# Patient Record
Sex: Female | Born: 1935 | Race: Black or African American | Hispanic: No | Smoking: Current every day smoker
Health system: Southern US, Community
[De-identification: ages and names within clinical notes are randomized; demographics above are authoritative.]

## PROBLEM LIST (undated history)

## (undated) DIAGNOSIS — M810 Age-related osteoporosis without current pathological fracture: Secondary | ICD-10-CM

## (undated) DIAGNOSIS — C50919 Malignant neoplasm of unspecified site of unspecified female breast: Secondary | ICD-10-CM

## (undated) DIAGNOSIS — Z923 Personal history of irradiation: Secondary | ICD-10-CM

## (undated) DIAGNOSIS — K219 Gastro-esophageal reflux disease without esophagitis: Secondary | ICD-10-CM

## (undated) DIAGNOSIS — E0590 Thyrotoxicosis, unspecified without thyrotoxic crisis or storm: Secondary | ICD-10-CM

## (undated) DIAGNOSIS — M1990 Unspecified osteoarthritis, unspecified site: Secondary | ICD-10-CM

## (undated) DIAGNOSIS — I10 Essential (primary) hypertension: Secondary | ICD-10-CM

## (undated) DIAGNOSIS — E785 Hyperlipidemia, unspecified: Secondary | ICD-10-CM

## (undated) DIAGNOSIS — E119 Type 2 diabetes mellitus without complications: Secondary | ICD-10-CM

## (undated) HISTORY — DX: Personal history of irradiation: Z92.3

## (undated) HISTORY — DX: Type 2 diabetes mellitus without complications: E11.9

## (undated) HISTORY — DX: Age-related osteoporosis without current pathological fracture: M81.0

## (undated) HISTORY — PX: ABDOMINAL HYSTERECTOMY: SHX81

## (undated) HISTORY — DX: Unspecified osteoarthritis, unspecified site: M19.90

## (undated) HISTORY — PX: BREAST SURGERY: SHX581

## (undated) HISTORY — DX: Hyperlipidemia, unspecified: E78.5

---

## 1986-03-26 HISTORY — PX: SPINE SURGERY: SHX786

## 1998-10-10 ENCOUNTER — Ambulatory Visit (HOSPITAL_COMMUNITY): Admission: RE | Admit: 1998-10-10 | Discharge: 1998-10-10 | Payer: Self-pay | Admitting: Family Medicine

## 1998-10-19 ENCOUNTER — Encounter: Payer: Self-pay | Admitting: Family Medicine

## 1998-10-19 ENCOUNTER — Ambulatory Visit (HOSPITAL_COMMUNITY): Admission: RE | Admit: 1998-10-19 | Discharge: 1998-10-19 | Payer: Self-pay

## 2000-11-19 ENCOUNTER — Encounter: Payer: Self-pay | Admitting: Family Medicine

## 2000-11-19 ENCOUNTER — Ambulatory Visit (HOSPITAL_COMMUNITY): Admission: RE | Admit: 2000-11-19 | Discharge: 2000-11-19 | Payer: Self-pay | Admitting: Family Medicine

## 2000-11-20 ENCOUNTER — Encounter: Payer: Self-pay | Admitting: Family Medicine

## 2000-11-20 ENCOUNTER — Ambulatory Visit (HOSPITAL_COMMUNITY): Admission: RE | Admit: 2000-11-20 | Discharge: 2000-11-20 | Payer: Self-pay | Admitting: Family Medicine

## 2002-03-20 ENCOUNTER — Ambulatory Visit (HOSPITAL_COMMUNITY): Admission: RE | Admit: 2002-03-20 | Discharge: 2002-03-20 | Payer: Self-pay | Admitting: Internal Medicine

## 2002-03-20 ENCOUNTER — Encounter: Payer: Self-pay | Admitting: Internal Medicine

## 2002-06-15 ENCOUNTER — Encounter (INDEPENDENT_AMBULATORY_CARE_PROVIDER_SITE_OTHER): Payer: Self-pay | Admitting: Specialist

## 2002-06-15 ENCOUNTER — Ambulatory Visit (HOSPITAL_COMMUNITY): Admission: RE | Admit: 2002-06-15 | Discharge: 2002-06-15 | Payer: Self-pay | Admitting: Gastroenterology

## 2007-07-23 ENCOUNTER — Encounter: Admission: RE | Admit: 2007-07-23 | Discharge: 2007-07-23 | Payer: Self-pay | Admitting: Internal Medicine

## 2008-09-01 ENCOUNTER — Encounter: Admission: RE | Admit: 2008-09-01 | Discharge: 2008-09-01 | Payer: Self-pay | Admitting: Internal Medicine

## 2009-09-16 ENCOUNTER — Encounter: Admission: RE | Admit: 2009-09-16 | Discharge: 2009-09-16 | Payer: Self-pay | Admitting: Internal Medicine

## 2010-02-21 ENCOUNTER — Encounter
Admission: RE | Admit: 2010-02-21 | Discharge: 2010-04-25 | Payer: Self-pay | Source: Home / Self Care | Attending: Internal Medicine | Admitting: Internal Medicine

## 2010-04-16 ENCOUNTER — Encounter: Payer: Self-pay | Admitting: Internal Medicine

## 2010-05-29 ENCOUNTER — Encounter: Admit: 2010-05-29 | Payer: Self-pay | Admitting: Internal Medicine

## 2010-05-29 ENCOUNTER — Encounter: Payer: Medicare Other | Attending: Internal Medicine | Admitting: *Deleted

## 2010-05-29 DIAGNOSIS — Z713 Dietary counseling and surveillance: Secondary | ICD-10-CM | POA: Insufficient documentation

## 2010-05-29 DIAGNOSIS — R634 Abnormal weight loss: Secondary | ICD-10-CM | POA: Insufficient documentation

## 2010-07-31 ENCOUNTER — Encounter: Payer: Medicare Other | Admitting: *Deleted

## 2010-08-11 NOTE — Op Note (Signed)
   NAME:  Rhonda Reynolds, Rhonda Reynolds                        ACCOUNT NO.:  [id number]   MEDICAL RECORD NO.:  [id number]                   PATIENT TYPE:  AMB   LOCATION:  ENDO                                 FACILITY:   PHYSICIAN:  Anselmo Rod, M.D.               DATE OF BIRTH:  14-Mar-1935   DATE OF PROCEDURE:  06/15/2002  DATE OF DISCHARGE:                                 OPERATIVE REPORT   PROCEDURE:  Esophagogastroduodenoscopy.   ENDOSCOPIST:  Charna Elizabeth, M.D.   INSTRUMENT USED:  Olympus video panendoscope.   INDICATIONS FOR PROCEDURE:  A 76 year old African-American female with a  history of epigastric pain.  Rule out peptic ulcer disease, esophagitis,  gastritis, etc.   PREPROCEDURE PREPARATION:  Informed consent was procured from the patient.  The patient was fasted for eight hours prior to the procedure.   PREPROCEDURE PHYSICAL:  VITAL SIGNS: The patient had stable vital signs.  NECK:  Supple.  CHEST:  Clear to auscultation.  CARDIAC:  S1 and S2 regular.  ABDOMEN:  Soft with normal bowel sounds.   DESCRIPTION OF PROCEDURE:  The patient was placed in the left lateral  decubitus position and sedated with 50 mg of Demerol and 5 mg of Versed  intravenously.  Once the patient was adequately sedated, maintained on low-  flow oxygen and continuous cardiac monitoring, the Olympus video  panendoscope was advance through the mouth piece over the tongue, into the  esophagus under direct vision.  The entire esophagus appeared normal with no  evidence of rings, stricture, masses, esophagitis or Barrett's mucosa.  The  scope was then advanced into the stomach. There was mild antral gastritis  noted.  On retroflexion, the high cardia revealed no abnormalities.  The  proximal small bowel was normal as well.   IMPRESSION:  Normal esophagogastroduodenoscopy except for mild antral  gastritis.   RECOMMENDATIONS:  Proceed with colonoscopy at this time.                      Anselmo Rod, M.D.    JNM/MEDQ  D:  06/15/2002  T:  06/15/2002  Job:  [id number]   cc:   Candyce Churn. Allyne Gee, M.D.  [address]  [address]  [place]  [place] [address]  Fax: [phone]

## 2010-11-09 ENCOUNTER — Other Ambulatory Visit: Payer: Self-pay | Admitting: Internal Medicine

## 2010-11-09 DIAGNOSIS — Z1231 Encounter for screening mammogram for malignant neoplasm of breast: Secondary | ICD-10-CM

## 2010-11-16 ENCOUNTER — Ambulatory Visit
Admission: RE | Admit: 2010-11-16 | Discharge: 2010-11-16 | Disposition: A | Discharge status: Home / Self Care | Payer: Medicare Other | Source: Ambulatory Visit | Attending: Internal Medicine | Admitting: Internal Medicine

## 2010-11-16 DIAGNOSIS — Z1231 Encounter for screening mammogram for malignant neoplasm of breast: Secondary | ICD-10-CM

## 2011-12-05 ENCOUNTER — Ambulatory Visit
Admission: RE | Admit: 2011-12-05 | Discharge: 2011-12-05 | Disposition: A | Discharge status: Home / Self Care | Payer: Medicare Other | Source: Ambulatory Visit | Attending: Internal Medicine | Admitting: Internal Medicine

## 2011-12-05 ENCOUNTER — Other Ambulatory Visit: Payer: Self-pay | Admitting: Internal Medicine

## 2011-12-05 DIAGNOSIS — Z1231 Encounter for screening mammogram for malignant neoplasm of breast: Secondary | ICD-10-CM

## 2012-03-26 HISTORY — PX: EYE SURGERY: SHX253

## 2012-12-18 ENCOUNTER — Other Ambulatory Visit: Payer: Self-pay

## 2012-12-18 DIAGNOSIS — Z1231 Encounter for screening mammogram for malignant neoplasm of breast: Secondary | ICD-10-CM

## 2013-01-16 ENCOUNTER — Ambulatory Visit
Admission: RE | Admit: 2013-01-16 | Discharge: 2013-01-16 | Disposition: A | Discharge status: Home / Self Care | Payer: Medicare Other | Source: Ambulatory Visit

## 2013-01-16 DIAGNOSIS — Z1231 Encounter for screening mammogram for malignant neoplasm of breast: Secondary | ICD-10-CM

## 2013-01-20 ENCOUNTER — Other Ambulatory Visit: Payer: Self-pay | Admitting: Internal Medicine

## 2013-01-20 DIAGNOSIS — R928 Other abnormal and inconclusive findings on diagnostic imaging of breast: Secondary | ICD-10-CM

## 2013-01-27 ENCOUNTER — Ambulatory Visit
Admission: RE | Admit: 2013-01-27 | Discharge: 2013-01-27 | Disposition: A | Discharge status: Home / Self Care | Payer: Medicare Other | Source: Ambulatory Visit | Attending: Internal Medicine | Admitting: Internal Medicine

## 2013-01-27 DIAGNOSIS — R928 Other abnormal and inconclusive findings on diagnostic imaging of breast: Secondary | ICD-10-CM

## 2013-02-04 ENCOUNTER — Ambulatory Visit (INDEPENDENT_AMBULATORY_CARE_PROVIDER_SITE_OTHER): Payer: Medicare Other | Admitting: Surgery

## 2013-02-04 ENCOUNTER — Encounter (INDEPENDENT_AMBULATORY_CARE_PROVIDER_SITE_OTHER): Payer: Self-pay | Admitting: Surgery

## 2013-02-04 VITALS — BP 122/78 | HR 76 | Resp 16 | Ht 67.0 in | Wt 85.0 lb

## 2013-02-04 DIAGNOSIS — R928 Other abnormal and inconclusive findings on diagnostic imaging of breast: Secondary | ICD-10-CM

## 2013-02-04 DIAGNOSIS — R921 Mammographic calcification found on diagnostic imaging of breast: Secondary | ICD-10-CM | POA: Insufficient documentation

## 2013-02-04 NOTE — Progress Notes (Signed)
Patient ID: Rhonda Reynolds, female   DOB: 08/09/1934, 78 y.o.   MRN: [id number]  Chief Complaint  Patient presents with  . New Evaluation    consult lt breast calcifications    HPI Rhonda Reynolds is a 78 y.o. female.   Referred by Dr. Lyman Bishop for evaluation of left breast microcalcifications.  PCP - Dr. Dorothyann Peng  HPI This is a 78 yo female in good health that presents after a recent routine screening mammogram that showed a 5mm area of linear microcalcifications located in the outer posterior left breast.  The patient has very ptotic breasts with minimal thickness of the breast tissue, and it was felt to be to risky to attempt stereotactic biopsy.  Surgical biopsy is recommended.  She presents now for discussion of excisional biopsy.    The patient used Premarin for about 15 years. No family history of breast cancer  Past Medical History  Diagnosis Date  . Arthritis   . Hyperlipidemia   . Osteoporosis     Past Surgical History  Procedure Laterality Date  . Abdominal hysterectomy    . Spine surgery      Family History  Problem Relation Age of Onset  . Cancer Mother     pt canot remember    Social History History  Substance Use Topics  . Smoking status: Current Every Day Smoker  . Smokeless tobacco: Never Used  . Alcohol Use: No    No Known Allergies  Current Outpatient Prescriptions  Medication Sig Dispense Refill  . Cholecalciferol (VITAMIN D PO) Take by mouth.      Marland Kitchen lisinopril-hydrochlorothiazide (PRINZIDE,ZESTORETIC) 20-12.5 MG per tablet        No current facility-administered medications for this visit.    Review of Systems Review of Systems  Constitutional: Negative for fever, chills and unexpected weight change.  HENT: Negative for congestion, hearing loss, sore throat, trouble swallowing and voice change.   Eyes: Negative for visual disturbance.  Respiratory: Negative for cough and wheezing.   Cardiovascular: Negative for chest pain,  palpitations and leg swelling.  Gastrointestinal: Negative for nausea, vomiting, abdominal pain, diarrhea, constipation, blood in stool, abdominal distention and anal bleeding.  Genitourinary: Negative for hematuria, vaginal bleeding and difficulty urinating.  Musculoskeletal: Negative for arthralgias.  Skin: Negative for rash and wound.  Neurological: Negative for seizures, syncope and headaches.  Hematological: Negative for adenopathy. Does not bruise/bleed easily.  Psychiatric/Behavioral: Negative for confusion.    Blood pressure 122/78, pulse 76, resp. rate 16, height 5\' 7"  (1.702 m), weight 85 lb (38.556 kg).  Physical Exam Physical Exam Very thin female in NAD HEENT:  EOMI, sclera anicteric Neck:  No masses, no thyromegaly Lungs:  CTA bilaterally; normal respiratory effort Breasts - minimal palpable breast tissue; no dominant masses in either breast; no axillary lymphadenopathy;   CV:  Regular rate and rhythm; no murmurs Abd:  +bowel sounds, soft, non-tender, no masses Ext:  Well-perfused; no edema Skin:  Warm, dry; no sign of jaundice  Data Reviewed Mm Digital Diag Ltd L  01/27/2013   CLINICAL DATA:  Callback from screening mammography with tomosynthesis, left breast calcifications.  EXAM: DIGITAL DIAGNOSTIC UNILATERAL LEFT MAMMOGRAM LIMITED  COMPARISON:  01/16/2013, 12/05/2011, dating back to 07/23/2007.  ACR Breast Density Category d: The breasts are extremely dense, which lowers the sensitivity of mammography.  FINDINGS: Spot magnification CC and MLO views of the calcifications in the outer left breast, posterior 1/3, were obtained. These calcifications are new when compared to prior examinations,  span approximately 5 mm, are in a linear distribution, and are of varying shapes and sizes. These calcifications are immediately adjacent to a benign oval-shaped calcification with a lucent center.  IMPRESSION: Indeterminate 5 mm group of calcifications in a linear orientation in the  outer left breast, posterior 1/3.  RECOMMENDATION: The patient's left breast compressed to approximately 1 cm in thickness on the current spot compression images, and therefore, stereotactic biopsy would not be technically possible, as this would require a minimum compression of 2 cm. Therefore, excisional biopsy is recommended.  An appointment has been made with Dr. Corliss Skains of [facility] for Wednesday, November 12, at 10 o'clock a.m.  I have discussed the findings and recommendations with the patient. Results were also provided in writing at the conclusion of the visit. If applicable, a reminder letter will be sent to the patient regarding the next appointment.  BI-RADS CATEGORY  4: Suspicious abnormality - biopsy should be considered.   Electronically Signed   By: Hulan Saas M.D.   On: 01/27/2013 15:45   Mm Digital Screening  01/16/2013   CLINICAL DATA:  Screening.  EXAM: DIGITAL SCREENING BILATERAL MAMMOGRAM WITH CAD  DIGITAL BREAST TOMOSYNTHESIS  Digital breast tomosynthesis images are acquired in two projections. These images are reviewed in combination with the digital mammogram, confirming the findings below.  COMPARISON:  Previous Exam(s)  ACR Breast Density Category d.  Extreme fibroglandular tissue  FINDINGS: In the left breast, calcifications warrant further evaluation with magnified views. In the right breast, no mass or malignant type calcifications are identified. Images were processed with CAD.  IMPRESSION: Further evaluation is suggested for calcifications in the left breast.  RECOMMENDATION: Diagnostic mammogram of the left breast. (Code:FI-L-34M)  The patient will be contacted regarding the findings, and additional imaging will be scheduled.  BI-RADS CATEGORY  0. Incomplete. Need additional imaging evaluation and/or prior mammograms for comparison.   Electronically Signed   By: Sherian Rein M.D.   On: 01/16/2013 13:38      Assessment    Cluster of microcalcifications  of the lateral left breast     Plan    Recommend needle-localized lumpectomy of the left breast.  The surgical procedure has been discussed with the patient.  Potential risks, benefits, alternative treatments, and expected outcomes have been explained.  All of the patient's questions at this time have been answered.  The likelihood of reaching the patient's treatment goal is good.  The patient understand the proposed surgical procedure.  She wants to think about it before agreeing to surgery.  If she chooses not to have surgery at this time, I would definitely recommend close follow-up mammogram (6 months) to see if the microcalcifications have changed.  I did not recommend waiting, but the patient seems reluctant to schedule surgery at this time.  She will call back with her decision.        Jasmain Ahlberg K. 02/04/2013, 3:50 PM

## 2013-02-04 NOTE — Patient Instructions (Signed)
Call our surgery schedulers at [phone] to schedule your surgery. 

## 2013-02-04 NOTE — Addendum Note (Signed)
Addended by: Wynona Luna on: 02/04/2013 04:03 PM   Modules accepted: Orders

## 2013-02-10 ENCOUNTER — Telehealth (INDEPENDENT_AMBULATORY_CARE_PROVIDER_SITE_OTHER): Payer: Self-pay | Admitting: General Surgery

## 2013-02-10 NOTE — Telephone Encounter (Signed)
LMOM to have patient to call back and ask for [facility]. She called and had question about breast surgery

## 2013-02-10 NOTE — Telephone Encounter (Signed)
Pt called to ask some generalized questions about proposed surgery.  Asked about anesthesia, driving home from surgery, driving in general after surgery.  All questions answered to her satisfaction.  She is still considering her options and will call back with decision soon.

## 2013-02-25 ENCOUNTER — Encounter (HOSPITAL_COMMUNITY): Payer: Self-pay | Admitting: Pharmacist

## 2013-03-02 ENCOUNTER — Encounter (HOSPITAL_COMMUNITY)
Admission: RE | Admit: 2013-03-02 | Discharge: 2013-03-02 | Disposition: A | Discharge status: Home / Self Care | Payer: Medicare Other | Source: Ambulatory Visit | Attending: Surgery | Admitting: Surgery

## 2013-03-02 ENCOUNTER — Ambulatory Visit (HOSPITAL_COMMUNITY)
Admission: RE | Admit: 2013-03-02 | Discharge: 2013-03-02 | Disposition: A | Discharge status: Home / Self Care | Payer: Medicare Other | Source: Ambulatory Visit | Attending: Anesthesiology | Admitting: Anesthesiology

## 2013-03-02 ENCOUNTER — Other Ambulatory Visit (HOSPITAL_COMMUNITY): Payer: Self-pay | Admitting: *Deleted

## 2013-03-02 ENCOUNTER — Encounter (HOSPITAL_COMMUNITY): Payer: Self-pay

## 2013-03-02 DIAGNOSIS — Z01818 Encounter for other preprocedural examination: Secondary | ICD-10-CM | POA: Insufficient documentation

## 2013-03-02 DIAGNOSIS — J449 Chronic obstructive pulmonary disease, unspecified: Secondary | ICD-10-CM | POA: Insufficient documentation

## 2013-03-02 DIAGNOSIS — C50919 Malignant neoplasm of unspecified site of unspecified female breast: Secondary | ICD-10-CM | POA: Insufficient documentation

## 2013-03-02 DIAGNOSIS — Z01810 Encounter for preprocedural cardiovascular examination: Secondary | ICD-10-CM | POA: Insufficient documentation

## 2013-03-02 DIAGNOSIS — J4489 Other specified chronic obstructive pulmonary disease: Secondary | ICD-10-CM | POA: Insufficient documentation

## 2013-03-02 DIAGNOSIS — Z01812 Encounter for preprocedural laboratory examination: Secondary | ICD-10-CM | POA: Insufficient documentation

## 2013-03-02 HISTORY — DX: Essential (primary) hypertension: I10

## 2013-03-02 HISTORY — DX: Gastro-esophageal reflux disease without esophagitis: K21.9

## 2013-03-02 LAB — CBC
HCT: 43.4 % (ref 36.0–46.0)
MCH: 31.5 pg (ref 26.0–34.0)
MCV: 92.3 fL (ref 78.0–100.0)
RBC: 4.7 MIL/uL (ref 3.87–5.11)
RDW: 14 % (ref 11.5–15.5)
WBC: 7 10*3/uL (ref 4.0–10.5)

## 2013-03-02 LAB — BASIC METABOLIC PANEL
BUN: 10 mg/dL (ref 6–23)
CO2: 27 mEq/L (ref 19–32)
Chloride: 101 mEq/L (ref 96–112)
Creatinine, Ser: 0.6 mg/dL (ref 0.50–1.10)
GFR calc Af Amer: >90 mL/min (ref >90)
GFR calc non Af Amer: 86 mL/min — ABNORMAL LOW (ref >90)
Glucose, Bld: 102 mg/dL — ABNORMAL HIGH (ref 70–99)
Potassium: 3.8 mEq/L (ref 3.5–5.1)

## 2013-03-02 NOTE — Progress Notes (Addendum)
req'd notes, ekg, any tests from pcp dr Zella Ball [facility]

## 2013-03-02 NOTE — Pre-Procedure Instructions (Addendum)
Rhonda Reynolds  03/02/2013   Your procedure is scheduled on: 03/04/13  Report to  [facility] short stay admitting at when finish at breast center AM.  Call this number if you have problems the morning of surgery: [phone]   Remember:   Do not eat food or drink liquids after midnight.   Take these medicines the morning of surgery with A SIP OF WATER: none   Do not wear jewelry, make-up or nail polish.  Do not wear lotions, powders, or perfumes. You may wear deodorant.  Do not shave 48 hours prior to surgery. Men may shave face and neck.  Do not bring valuables to the hospital.  [facility] is not responsible                  for any belongings or valuables.               Contacts, dentures or bridgework may not be worn into surgery.  Leave suitcase in the car. After surgery it may be brought to your room.  For patients admitted to the hospital, discharge time is determined by your                treatment team.               Patients discharged the day of surgery will not be allowed to drive  home.  Name and phone number of your driver:   Special Instructions: Shower using CHG 2 nights before surgery and the night before surgery.  If you shower the day of surgery use CHG.  Use special wash - you have one bottle of CHG for all showers.  You should use approximately 1/3 of the bottle for each shower.   Please read over the following fact sheets that you were given: Pain Booklet, Coughing and Deep Breathing and Surgical Site Infection Prevention

## 2013-03-03 MED ORDER — CHLORHEXIDINE GLUCONATE 4 % EX LIQD: 1.0000 "application " | Freq: Once | CUTANEOUS | Status: DC

## 2013-03-03 MED ORDER — CEFAZOLIN SODIUM-DEXTROSE 2-3 GM-% IV SOLR
2.0000 g | INTRAVENOUS | Status: AC
  Administered 2013-03-04: 2 g via INTRAVENOUS
  Filled 2013-03-03: qty 50

## 2013-03-03 NOTE — Progress Notes (Signed)
Anesthesia Chart Review:  Patient is a 78 year old female scheduled for left needle localized lumpectomy on 03/04/13 by Dr. Corliss Skains.  History includes smoking, HTN, GERD, HLD, osteoporosis, arthritis, hysterectomy, back surgery.  Her BMI is recorded as only 13.  PCP is Dr. Dorothyann Peng.    EKG on 03/02/13 showed NSR, LAE, LAFB. EKG appears stable when compared to prior EKG at Dr. Zella Ball office from 06/02/12.  No CV symptoms documented at her PAT visit.  CXR on 03/02/13 showed COPD, no acute cardiopulmonary abnormality.  Preoperative labs noted.  If no acute changes then I anticipate that she can proceed as planned.  Velna Ochs [facility] Short Stay Center/Anesthesiology Phone [phone] 03/03/2013 9:53 AM

## 2013-03-04 ENCOUNTER — Ambulatory Visit
Admission: RE | Admit: 2013-03-04 | Discharge: 2013-03-04 | Disposition: A | Discharge status: Home / Self Care | Payer: Medicare Other | Source: Ambulatory Visit | Attending: Surgery | Admitting: Surgery

## 2013-03-04 ENCOUNTER — Ambulatory Visit (HOSPITAL_COMMUNITY)
Admission: RE | Admit: 2013-03-04 | Discharge: 2013-03-04 | Disposition: A | Discharge status: Home / Self Care | Payer: Medicare Other | Source: Ambulatory Visit | Attending: Surgery | Admitting: Surgery

## 2013-03-04 ENCOUNTER — Encounter (HOSPITAL_COMMUNITY)
Admission: RE | Disposition: A | Discharge status: Home / Self Care | Payer: Self-pay | Source: Ambulatory Visit | Attending: Surgery

## 2013-03-04 ENCOUNTER — Ambulatory Visit (HOSPITAL_COMMUNITY): Payer: Medicare Other | Admitting: Anesthesiology

## 2013-03-04 ENCOUNTER — Encounter (HOSPITAL_COMMUNITY): Payer: Self-pay | Admitting: *Deleted

## 2013-03-04 ENCOUNTER — Encounter (HOSPITAL_COMMUNITY): Payer: Medicare Other | Admitting: Vascular Surgery

## 2013-03-04 DIAGNOSIS — N6029 Fibroadenosis of unspecified breast: Secondary | ICD-10-CM | POA: Insufficient documentation

## 2013-03-04 DIAGNOSIS — R921 Mammographic calcification found on diagnostic imaging of breast: Secondary | ICD-10-CM

## 2013-03-04 DIAGNOSIS — M810 Age-related osteoporosis without current pathological fracture: Secondary | ICD-10-CM | POA: Insufficient documentation

## 2013-03-04 DIAGNOSIS — F17200 Nicotine dependence, unspecified, uncomplicated: Secondary | ICD-10-CM | POA: Insufficient documentation

## 2013-03-04 DIAGNOSIS — K219 Gastro-esophageal reflux disease without esophagitis: Secondary | ICD-10-CM | POA: Insufficient documentation

## 2013-03-04 DIAGNOSIS — D0590 Unspecified type of carcinoma in situ of unspecified breast: Secondary | ICD-10-CM | POA: Insufficient documentation

## 2013-03-04 DIAGNOSIS — M129 Arthropathy, unspecified: Secondary | ICD-10-CM | POA: Insufficient documentation

## 2013-03-04 DIAGNOSIS — I10 Essential (primary) hypertension: Secondary | ICD-10-CM | POA: Insufficient documentation

## 2013-03-04 DIAGNOSIS — E785 Hyperlipidemia, unspecified: Secondary | ICD-10-CM | POA: Insufficient documentation

## 2013-03-04 HISTORY — PX: BREAST LUMPECTOMY WITH NEEDLE LOCALIZATION: SHX5759

## 2013-03-04 HISTORY — PX: BREAST LUMPECTOMY: SHX2

## 2013-03-04 SURGERY — BREAST LUMPECTOMY WITH NEEDLE LOCALIZATION
Anesthesia: General | Laterality: Left

## 2013-03-04 MED ORDER — LACTATED RINGERS IV SOLN
INTRAVENOUS | Status: DC
  Administered 2013-03-04: 13:00:00 via INTRAVENOUS

## 2013-03-04 MED ORDER — 0.9 % SODIUM CHLORIDE (POUR BTL) OPTIME
TOPICAL | Status: DC | PRN
  Administered 2013-03-04: 1000 mL

## 2013-03-04 MED ORDER — LACTATED RINGERS IV SOLN
INTRAVENOUS | Status: DC | PRN
  Administered 2013-03-04: 14:00:00 via INTRAVENOUS

## 2013-03-04 MED ORDER — HYDROCODONE-ACETAMINOPHEN 5-325 MG PO TABS: 1.0000 | ORAL_TABLET | ORAL | Status: DC | PRN

## 2013-03-04 MED ORDER — PHENYLEPHRINE HCL 10 MG/ML IJ SOLN
INTRAMUSCULAR | Status: DC | PRN
  Administered 2013-03-04: 40 ug via INTRAVENOUS
  Administered 2013-03-04 (×2): 120 ug via INTRAVENOUS
  Administered 2013-03-04: 40 ug via INTRAVENOUS
  Administered 2013-03-04: 80 ug via INTRAVENOUS

## 2013-03-04 MED ORDER — PHENYLEPHRINE HCL 10 MG/ML IJ SOLN
10.0000 mg | INTRAVENOUS | Status: DC | PRN
  Administered 2013-03-04: 10 ug/min via INTRAVENOUS

## 2013-03-04 MED ORDER — MIDAZOLAM HCL 2 MG/2ML IJ SOLN: 1.0000 mg | INTRAMUSCULAR | Status: DC | PRN

## 2013-03-04 MED ORDER — FENTANYL CITRATE 0.05 MG/ML IJ SOLN
INTRAMUSCULAR | Status: DC | PRN
  Administered 2013-03-04: 50 ug via INTRAVENOUS

## 2013-03-04 MED ORDER — ONDANSETRON HCL 4 MG/2ML IJ SOLN: 4.0000 mg | INTRAMUSCULAR | Status: DC | PRN

## 2013-03-04 MED ORDER — FENTANYL CITRATE 0.05 MG/ML IJ SOLN: 50.0000 ug | Freq: Once | INTRAMUSCULAR | Status: DC

## 2013-03-04 MED ORDER — LIDOCAINE HCL 1 % IJ SOLN
INTRAMUSCULAR | Status: DC | PRN
  Administered 2013-03-04: 60 mg via INTRADERMAL

## 2013-03-04 MED ORDER — ONDANSETRON HCL 4 MG/2ML IJ SOLN
INTRAMUSCULAR | Status: DC | PRN
  Administered 2013-03-04: 4 mg via INTRAVENOUS

## 2013-03-04 MED ORDER — BUPIVACAINE-EPINEPHRINE 0.25% -1:200000 IJ SOLN
INTRAMUSCULAR | Status: DC | PRN
  Administered 2013-03-04: 3 mL

## 2013-03-04 MED ORDER — MORPHINE SULFATE 2 MG/ML IJ SOLN: 2.0000 mg | INTRAMUSCULAR | Status: DC | PRN

## 2013-03-04 MED ORDER — PROPOFOL 10 MG/ML IV BOLUS
INTRAVENOUS | Status: DC | PRN
  Administered 2013-03-04: 100 mg via INTRAVENOUS

## 2013-03-04 MED ORDER — BUPIVACAINE-EPINEPHRINE (PF) 0.25% -1:200000 IJ SOLN
INTRAMUSCULAR | Status: AC
  Filled 2013-03-04: qty 30

## 2013-03-04 SURGICAL SUPPLY — 41 items
APPLIER CLIP 9.375 MED OPEN (MISCELLANEOUS)
BENZOIN TINCTURE PRP APPL 2/3 (GAUZE/BANDAGES/DRESSINGS) ×2 IMPLANT
BINDER BREAST LRG (GAUZE/BANDAGES/DRESSINGS) IMPLANT
BINDER BREAST XLRG (GAUZE/BANDAGES/DRESSINGS) IMPLANT
BLADE SURG ROTATE 9660 (MISCELLANEOUS) IMPLANT
CHLORAPREP W/TINT 26ML (MISCELLANEOUS) ×2 IMPLANT
CLIP APPLIE 9.375 MED OPEN (MISCELLANEOUS) IMPLANT
CLIP TI WIDE RED SMALL 24 (CLIP) IMPLANT
CLSR STERI-STRIP ANTIMIC 1/2X4 (GAUZE/BANDAGES/DRESSINGS) ×2 IMPLANT
CONT SPEC 4OZ CLIKSEAL STRL BL (MISCELLANEOUS) ×2 IMPLANT
COVER SURGICAL LIGHT HANDLE (MISCELLANEOUS) ×2 IMPLANT
DECANTER SPIKE VIAL GLASS SM (MISCELLANEOUS) IMPLANT
DEVICE DUBIN SPECIMEN MAMMOGRA (MISCELLANEOUS) IMPLANT
DRAPE LAPAROTOMY TRNSV 102X78 (DRAPE) ×2 IMPLANT
DRAPE UTILITY 15X26 W/TAPE STR (DRAPE) ×4 IMPLANT
DRSG OPSITE POSTOP 3X4 (GAUZE/BANDAGES/DRESSINGS) ×2 IMPLANT
ELECT REM PT RETURN 9FT ADLT (ELECTROSURGICAL) ×2
ELECTRODE REM PT RTRN 9FT ADLT (ELECTROSURGICAL) ×1 IMPLANT
GAUZE SPONGE 4X4 16PLY XRAY LF (GAUZE/BANDAGES/DRESSINGS) ×2 IMPLANT
GLOVE BIO SURGEON STRL SZ7 (GLOVE) ×2 IMPLANT
GLOVE BIOGEL PI IND STRL 7.5 (GLOVE) ×1 IMPLANT
GLOVE BIOGEL PI INDICATOR 7.5 (GLOVE) ×1
GOWN STRL NON-REIN LRG LVL3 (GOWN DISPOSABLE) ×4 IMPLANT
KIT BASIN OR (CUSTOM PROCEDURE TRAY) ×2 IMPLANT
KIT MARKER MARGIN INK (KITS) IMPLANT
KIT ROOM TURNOVER OR (KITS) ×2 IMPLANT
NEEDLE HYPO 25GX1X1/2 BEV (NEEDLE) ×2 IMPLANT
NS IRRIG 1000ML POUR BTL (IV SOLUTION) ×2 IMPLANT
PACK GENERAL/GYN (CUSTOM PROCEDURE TRAY) ×2 IMPLANT
PAD ARMBOARD 7.5X6 YLW CONV (MISCELLANEOUS) ×4 IMPLANT
SPECIMEN JAR MEDIUM (MISCELLANEOUS) ×2 IMPLANT
SPONGE GAUZE 4X4 12PLY (GAUZE/BANDAGES/DRESSINGS) ×2 IMPLANT
STAPLER VISISTAT 35W (STAPLE) ×2 IMPLANT
STRIP CLOSURE SKIN 1/2X4 (GAUZE/BANDAGES/DRESSINGS) ×2 IMPLANT
SUT MNCRL AB 4-0 PS2 18 (SUTURE) ×2 IMPLANT
SUT VIC AB 3-0 SH 27 (SUTURE) ×1
SUT VIC AB 3-0 SH 27X BRD (SUTURE) ×1 IMPLANT
SYR CONTROL 10ML LL (SYRINGE) ×2 IMPLANT
TOWEL OR 17X24 6PK STRL BLUE (TOWEL DISPOSABLE) ×2 IMPLANT
TOWEL OR 17X26 10 PK STRL BLUE (TOWEL DISPOSABLE) ×2 IMPLANT
WATER STERILE IRR 1000ML POUR (IV SOLUTION) IMPLANT

## 2013-03-04 NOTE — Op Note (Signed)
Pre-op Diagnosis:  Abnormal mammogram - left breast Post-op Diagnosis;  Same Procedure:  Left needle-localized lumpectomy Surgeon:  Lynnell Fiumara K. Anesthesia:  Gen-LMA Indications - This is a 78 yo female that presents after a recent mammogram showed a 5 mm area of linear microcalcifications in the lateral posterior left breast.  This area was not amenable to a safe stereotactic biopsy, so surgical biopsy was recommended.  A wire was placed by radiology prior to surgery.  Description of procedure:  After an adequate level of anesthesia was obtained, her left breast was prepped with Chloraprep and draped in sterile fashion.  A transverse incision was made to include the insertion site.  A cylinder of breast tissue was excised using cautery, extending down about 2.5 cm.  The specimen was oriented with a paint kit and a specimen mammogram confirmed the microcalcifications.  The wound was closed with 3-0 Vicryl and 4-0 monocryl.  Steri-strips and a clean dressing were applied.  Counts were correct.  She was extubated and brought to the recovery room in stable condition.  Wilmon Arms. Corliss Skains, MD, [facility]  General/ Trauma Surgery  03/04/2013 3:22 PM

## 2013-03-04 NOTE — Anesthesia Postprocedure Evaluation (Signed)
  Anesthesia Post-op Note  Patient: Rhonda Reynolds  Procedure(s) Performed: Procedure(s): LEFT BREAST LUMPECTOMY WITH NEEDLE LOCALIZATION (Left)  Patient Location: PACU  Anesthesia Type:General  Level of Consciousness: awake  Airway and Oxygen Therapy: Patient Spontanous Breathing  Post-op Pain: mild  Post-op Assessment: Post-op Vital signs reviewed, Patient's Cardiovascular Status Stable, Respiratory Function Stable, Patent Airway, No signs of Nausea or vomiting and Pain level controlled  Post-op Vital Signs: Reviewed and stable  Complications: No apparent anesthesia complications

## 2013-03-04 NOTE — Anesthesia Preprocedure Evaluation (Signed)
Anesthesia Evaluation  Patient identified by MRN, date of birth, ID band Patient awake    Reviewed: Allergy & Precautions, H&P , NPO status , Patient's Chart, lab work & pertinent test results  Airway Mallampati: I TM Distance: >3 FB Neck ROM: Full    Dental   Pulmonary Current Smoker,  + rhonchi         Cardiovascular hypertension, Rhythm:Regular Rate:Normal     Neuro/Psych    GI/Hepatic GERD-  ,  Endo/Other    Renal/GU      Musculoskeletal   Abdominal   Peds  Hematology   Anesthesia Other Findings   Reproductive/Obstetrics                           Anesthesia Physical Anesthesia Plan  ASA: II  Anesthesia Plan: General   Post-op Pain Management:    Induction: Intravenous  Airway Management Planned: LMA  Additional Equipment:   Intra-op Plan:   Post-operative Plan: Extubation in OR  Informed Consent: I have reviewed the patients History and Physical, chart, labs and discussed the procedure including the risks, benefits and alternatives for the proposed anesthesia with the patient or authorized representative who has indicated his/her understanding and acceptance.     Plan Discussed with: CRNA and Surgeon  Anesthesia Plan Comments:         Anesthesia Quick Evaluation

## 2013-03-04 NOTE — Interval H&P Note (Signed)
History and Physical Interval Note:  03/04/2013 1:59 PM  Rhonda Reynolds  has presented today for surgery, with the diagnosis of left breast microcalcifications  The various methods of treatment have been discussed with the patient and family. After consideration of risks, benefits and other options for treatment, the patient has consented to  Procedure(s): LEFT BREAST LUMPECTOMY WITH NEEDLE LOCALIZATION (Left) as a surgical intervention .  The patient's history has been reviewed, patient examined, no change in status, stable for surgery.  I have reviewed the patient's chart and labs.  Questions were answered to the patient's satisfaction.     Lulie Hurd K.

## 2013-03-04 NOTE — Interval H&P Note (Signed)
History and Physical Interval Note:  03/04/2013 1:59 PM  Rhonda Reynolds  has presented today for surgery, with the diagnosis of left breast microcalcifications  The various methods of treatment have been discussed with the patient and family. After consideration of risks, benefits and other options for treatment, the patient has consented to  Procedure(s): LEFT BREAST LUMPECTOMY WITH NEEDLE LOCALIZATION (Left) as a surgical intervention .  The patient's history has been reviewed, patient examined, no change in status, stable for surgery.  I have reviewed the patient's chart and labs.  Questions were answered to the patient's satisfaction.     Lavell Ridings K.   

## 2013-03-04 NOTE — Preoperative (Signed)
Beta Blockers   Reason not to administer Beta Blockers:Not Applicable 

## 2013-03-04 NOTE — H&P (View-Only) (Signed)
Patient ID: Rhonda Reynolds, female   DOB: 03/04/1935, 78 y.o.   MRN: [id number]  Chief Complaint  Patient presents with  . New Evaluation    consult lt breast calcifications    HPI Rhonda Reynolds is a 78 y.o. female.   Referred by Dr. Lawrence for evaluation of left breast microcalcifications.  PCP - Dr. Robyn Sanders  HPI This is a 78 yo female in good health that presents after a recent routine screening mammogram that showed a 5mm area of linear microcalcifications located in the outer posterior left breast.  The patient has very ptotic breasts with minimal thickness of the breast tissue, and it was felt to be to risky to attempt stereotactic biopsy.  Surgical biopsy is recommended.  She presents now for discussion of excisional biopsy.    The patient used Premarin for about 15 years. No family history of breast cancer  Past Medical History  Diagnosis Date  . Arthritis   . Hyperlipidemia   . Osteoporosis     Past Surgical History  Procedure Laterality Date  . Abdominal hysterectomy    . Spine surgery      Family History  Problem Relation Age of Onset  . Cancer Mother     pt canot remember    Social History History  Substance Use Topics  . Smoking status: Current Every Day Smoker  . Smokeless tobacco: Never Used  . Alcohol Use: No    No Known Allergies  Current Outpatient Prescriptions  Medication Sig Dispense Refill  . Cholecalciferol (VITAMIN D PO) Take by mouth.      . lisinopril-hydrochlorothiazide (PRINZIDE,ZESTORETIC) 20-12.5 MG per tablet        No current facility-administered medications for this visit.    Review of Systems Review of Systems  Constitutional: Negative for fever, chills and unexpected weight change.  HENT: Negative for congestion, hearing loss, sore throat, trouble swallowing and voice change.   Eyes: Negative for visual disturbance.  Respiratory: Negative for cough and wheezing.   Cardiovascular: Negative for chest pain,  palpitations and leg swelling.  Gastrointestinal: Negative for nausea, vomiting, abdominal pain, diarrhea, constipation, blood in stool, abdominal distention and anal bleeding.  Genitourinary: Negative for hematuria, vaginal bleeding and difficulty urinating.  Musculoskeletal: Negative for arthralgias.  Skin: Negative for rash and wound.  Neurological: Negative for seizures, syncope and headaches.  Hematological: Negative for adenopathy. Does not bruise/bleed easily.  Psychiatric/Behavioral: Negative for confusion.    Blood pressure 122/78, pulse 76, resp. rate 16, height 5' 7" (1.702 m), weight 85 lb (38.556 kg).  Physical Exam Physical Exam Very thin female in NAD HEENT:  EOMI, sclera anicteric Neck:  No masses, no thyromegaly Lungs:  CTA bilaterally; normal respiratory effort Breasts - minimal palpable breast tissue; no dominant masses in either breast; no axillary lymphadenopathy;   CV:  Regular rate and rhythm; no murmurs Abd:  +bowel sounds, soft, non-tender, no masses Ext:  Well-perfused; no edema Skin:  Warm, dry; no sign of jaundice  Data Reviewed Mm Digital Diag Ltd L  01/27/2013   CLINICAL DATA:  Callback from screening mammography with tomosynthesis, left breast calcifications.  EXAM: DIGITAL DIAGNOSTIC UNILATERAL LEFT MAMMOGRAM LIMITED  COMPARISON:  01/16/2013, 12/05/2011, dating back to 07/23/2007.  ACR Breast Density Category d: The breasts are extremely dense, which lowers the sensitivity of mammography.  FINDINGS: Spot magnification CC and MLO views of the calcifications in the outer left breast, posterior 1/3, were obtained. These calcifications are new when compared to prior examinations,   span approximately 5 mm, are in a linear distribution, and are of varying shapes and sizes. These calcifications are immediately adjacent to a benign oval-shaped calcification with a lucent center.  IMPRESSION: Indeterminate 5 mm group of calcifications in a linear orientation in the  outer left breast, posterior 1/3.  RECOMMENDATION: The patient's left breast compressed to approximately 1 cm in thickness on the current spot compression images, and therefore, stereotactic biopsy would not be technically possible, as this would require a minimum compression of 2 cm. Therefore, excisional biopsy is recommended.  An appointment has been made with Dr. Shulamis Wenberg of [facility] for Wednesday, November 12, at 10 o'clock a.m.  I have discussed the findings and recommendations with the patient. Results were also provided in writing at the conclusion of the visit. If applicable, a reminder letter will be sent to the patient regarding the next appointment.  BI-RADS CATEGORY  4: Suspicious abnormality - biopsy should be considered.   Electronically Signed   By: Thomas  Lawrence M.D.   On: 01/27/2013 15:45   Mm Digital Screening  01/16/2013   CLINICAL DATA:  Screening.  EXAM: DIGITAL SCREENING BILATERAL MAMMOGRAM WITH CAD  DIGITAL BREAST TOMOSYNTHESIS  Digital breast tomosynthesis images are acquired in two projections. These images are reviewed in combination with the digital mammogram, confirming the findings below.  COMPARISON:  Previous Exam(s)  ACR Breast Density Category d.  Extreme fibroglandular tissue  FINDINGS: In the left breast, calcifications warrant further evaluation with magnified views. In the right breast, no mass or malignant type calcifications are identified. Images were processed with CAD.  IMPRESSION: Further evaluation is suggested for calcifications in the left breast.  RECOMMENDATION: Diagnostic mammogram of the left breast. (Code:FI-L-00M)  The patient will be contacted regarding the findings, and additional imaging will be scheduled.  BI-RADS CATEGORY  0. Incomplete. Need additional imaging evaluation and/or prior mammograms for comparison.   Electronically Signed   By: Wei-Chen  Lin M.D.   On: 01/16/2013 13:38      Assessment    Cluster of microcalcifications  of the lateral left breast     Plan    Recommend needle-localized lumpectomy of the left breast.  The surgical procedure has been discussed with the patient.  Potential risks, benefits, alternative treatments, and expected outcomes have been explained.  All of the patient's questions at this time have been answered.  The likelihood of reaching the patient's treatment goal is good.  The patient understand the proposed surgical procedure.  She wants to think about it before agreeing to surgery.  If she chooses not to have surgery at this time, I would definitely recommend close follow-up mammogram (6 months) to see if the microcalcifications have changed.  I did not recommend waiting, but the patient seems reluctant to schedule surgery at this time.  She will call back with her decision.        Kishana Battey K. 02/04/2013, 3:50 PM    

## 2013-03-04 NOTE — Transfer of Care (Signed)
Immediate Anesthesia Transfer of Care Note  Patient: Rhonda Reynolds  Procedure(s) Performed: Procedure(s): LEFT BREAST LUMPECTOMY WITH NEEDLE LOCALIZATION (Left)  Patient Location: PACU  Anesthesia Type:General  Level of Consciousness: awake, alert  and oriented  Airway & Oxygen Therapy: Patient Spontanous Breathing and Patient connected to face mask oxygen  Post-op Assessment: Report given to PACU RN  Post vital signs: Reviewed and stable  Complications: No apparent anesthesia complications

## 2013-03-04 NOTE — Anesthesia Procedure Notes (Signed)
Procedure Name: LMA Insertion Date/Time: 03/04/2013 2:40 PM Performed by: Angelica Pou Pre-anesthesia Checklist: Patient identified, Patient being monitored, Emergency Drugs available, Timeout performed and Suction available Patient Re-evaluated:Patient Re-evaluated prior to inductionOxygen Delivery Method: Circle system utilized and Ambu bag Preoxygenation: Pre-oxygenation with 100% oxygen Intubation Type: IV induction LMA: LMA inserted LMA Size: 4.0 Number of attempts: 2 Placement Confirmation: breath sounds checked- equal and bilateral and positive ETCO2 Tube secured with: Tape Dental Injury: Teeth and Oropharynx as per pre-operative assessment  Comments: Smooth IV induction by Dr Gypsy Balsam; easy atraumatic placement of #3 LMA, seated poorly with air leak at 7cmH20, removed; followed with easy atraumatic placement of #4 LMA with improved ventilation.

## 2013-03-05 ENCOUNTER — Encounter (HOSPITAL_COMMUNITY): Payer: Self-pay | Admitting: Surgery

## 2013-03-11 ENCOUNTER — Other Ambulatory Visit (INDEPENDENT_AMBULATORY_CARE_PROVIDER_SITE_OTHER): Payer: Self-pay | Admitting: Surgery

## 2013-03-11 DIAGNOSIS — C50912 Malignant neoplasm of unspecified site of left female breast: Secondary | ICD-10-CM

## 2013-03-11 NOTE — Progress Notes (Signed)
Abnormal mammogram - linear cluster of microcalcifications.  Not amenable to stereotactic biopsy because of proximity to chest wall.  She underwent needle-localized biopsy on 03/04/13, Which revealed DCIS spanning 1.2 cm. This is high-grade DCIS. The specimen is focally clear by 0.1 cm to the posterior margin is 0.2 cm of the inferior margin. The lumpectomy did take the tissue off of the chest wall so there is no further breast tissue posteriorly. The specimen is ER negative PR negative.  I discussed the findings with the patient by telephone. We will refer her to radiation oncology for further consultation and possible radiation treatment. We will leave any decisions regarding further imaging (MRI) to radiation oncology. She is scheduled to come back to see me for followup visit on 12/31.  The patient is in good spirits and is doing quite well.  Wilmon Arms. Corliss Skains, MD, [facility]  General/ Trauma Surgery  03/11/2013 5:06 PM

## 2013-03-24 ENCOUNTER — Encounter (INDEPENDENT_AMBULATORY_CARE_PROVIDER_SITE_OTHER): Payer: Medicare Other | Admitting: Surgery

## 2013-03-25 ENCOUNTER — Encounter (INDEPENDENT_AMBULATORY_CARE_PROVIDER_SITE_OTHER): Payer: Self-pay | Admitting: Surgery

## 2013-03-25 ENCOUNTER — Ambulatory Visit (INDEPENDENT_AMBULATORY_CARE_PROVIDER_SITE_OTHER): Payer: Medicare Other | Admitting: Surgery

## 2013-03-25 DIAGNOSIS — C50919 Malignant neoplasm of unspecified site of unspecified female breast: Secondary | ICD-10-CM

## 2013-03-25 DIAGNOSIS — D0512 Intraductal carcinoma in situ of left breast: Secondary | ICD-10-CM

## 2013-03-25 NOTE — Progress Notes (Signed)
Status post needle localized lumpectomy on 03/04/13. This was an area of microcalcifications that was not amenable to stereotactic biopsy because of the proximity to the chest wall. The biopsy showed high-grade DCIS spanning 1.2 cm with clear margins. The posterior margin which is the chest wall was clear by 0.1 cm. The inferior margin is clear by 0.2 cm. ER negative PR negative. She has an appointment next week to see Dr. Roselind Messier to discuss possible radiation treatments. Her incision is healing well. She had a little bit of superficial skin separation but this is dry and healing well. No sign of infection. The patient is very thin breast tissue and her ribs are easily palpable.  We will see her back in 3 months or sooner if needed.  Wilmon Arms. Corliss Skains, MD, [facility] Surgery  General/ Trauma Surgery  03/25/2013 9:22 AM

## 2013-03-26 DIAGNOSIS — Z923 Personal history of irradiation: Secondary | ICD-10-CM

## 2013-03-26 HISTORY — DX: Personal history of irradiation: Z92.3

## 2013-03-27 NOTE — Progress Notes (Signed)
Location of Breast Cancer: lateral posterior left breast  Histology per Pathology Report:   Breast, lumpectomy, Left - DUCTAL CARCINOMA IN SITU WITH CALCIFICATIONS, HIGH GRADE, SPANNING AT LEAST 1.2 CM. - DUCTAL CARCINOMA IN SITU IS FOCALLY 0.1 CM TO THE POSTERIOR MARGIN AND 0.2 CM TO THE INFERIOR MARGIN. - FIBROCYSTIC CHANGES WITH ADENOSIS AND CALCIFICATIONS.  Receptor Status: ER(0% negative), PR (0% negative), Her2-neu (not done)  Did patient present with symptoms (if so, please note symptoms) or was this found on screening mammography?: recent routine screening mammogram that showed a 43m area of linear microcalcifications located in the outer posterior left breast.  Past/Anticipated interventions by surgeon, if any: Procedure: LEFT BREAST LUMPECTOMY WITH NEEDLE LOCALIZATION;  Surgeon: MImogene Burn TGeorgette Dover MD;  Location: [facility]  Service: General;  Laterality: Left;   Past/Anticipated interventions by medical oncology, if any:  no  Lymphedema issues, if any:  no    Pain issues, if any:  no   SAFETY ISSUES:  Prior radiation? no  Pacemaker/ICD? no  Possible current pregnancy?no  Is the patient on methotrexate? no  Current Complaints / other details:  Has 4 children, was 89at age of first period, was 89years at birth of first child, used estrogen for 10 years.  Here today with her daughter.

## 2013-04-01 ENCOUNTER — Ambulatory Visit
Admission: RE | Admit: 2013-04-01 | Discharge: 2013-04-01 | Disposition: A | Discharge status: Home / Self Care | Payer: Medicare Other | Source: Ambulatory Visit | Attending: Radiation Oncology | Admitting: Radiation Oncology

## 2013-04-01 ENCOUNTER — Encounter: Payer: Self-pay | Admitting: Radiation Oncology

## 2013-04-01 VITALS — BP 166/92 | HR 79 | Temp 97.8°F | Ht 67.0 in | Wt 90.0 lb

## 2013-04-01 DIAGNOSIS — C50919 Malignant neoplasm of unspecified site of unspecified female breast: Secondary | ICD-10-CM | POA: Insufficient documentation

## 2013-04-01 DIAGNOSIS — Z79899 Other long term (current) drug therapy: Secondary | ICD-10-CM | POA: Insufficient documentation

## 2013-04-01 DIAGNOSIS — K219 Gastro-esophageal reflux disease without esophagitis: Secondary | ICD-10-CM | POA: Insufficient documentation

## 2013-04-01 DIAGNOSIS — F17200 Nicotine dependence, unspecified, uncomplicated: Secondary | ICD-10-CM | POA: Insufficient documentation

## 2013-04-01 DIAGNOSIS — D0512 Intraductal carcinoma in situ of left breast: Secondary | ICD-10-CM

## 2013-04-01 DIAGNOSIS — Z171 Estrogen receptor negative status [ER-]: Secondary | ICD-10-CM | POA: Insufficient documentation

## 2013-04-01 DIAGNOSIS — E785 Hyperlipidemia, unspecified: Secondary | ICD-10-CM | POA: Insufficient documentation

## 2013-04-01 DIAGNOSIS — I10 Essential (primary) hypertension: Secondary | ICD-10-CM | POA: Insufficient documentation

## 2013-04-01 HISTORY — DX: Thyrotoxicosis, unspecified without thyrotoxic crisis or storm: E05.90

## 2013-04-01 HISTORY — DX: Malignant neoplasm of unspecified site of unspecified female breast: C50.919

## 2013-04-01 NOTE — Progress Notes (Signed)
Radiation Oncology         [phone] ________________________________  Initial outpatient Consultation  Name: Rhonda Reynolds MRN: [id number]  Date: 04/01/2013  DOB: 12-Jul-1934  KN:[id number],LPFXT N, MD  Maia Petties., MD   REFERRING PHYSICIAN: Maia Petties., MD  DIAGNOSIS high-grade intraductal carcinoma of the left breast  HISTORY OF PRESENT ILLNESS::Rhonda Reynolds is a 79 y.o. female who is seen out of the courtesy of Dr. Georgette Dover for an opinion concerning radiation therapy as part of management of patient's recently diagnosed intraductal carcinoma of the left breast. Last October the patient underwent screening mammography which revealed  suspicious calcifications within the upper-outer aspect of the left breast.  a digital diagnostic mammogram of the left breast was performed which revealed indeterminate 5 mm group of calcifications located in a linear orientation in the outer left breast, posterior one third. Patient proceeded to undergo needle localization and biopsy of this area which revealed high-grade intraductal carcinoma.  estrogen and progesterone receptors were both negative. The surgical margins were clear with the closest margin being 0.1 cm, posterior margin. The tumor extended over approximately 1.2 cm. The patient has done well since her surgery. She is now seen in radiation oncology for consideration for breast conserving therapy.  PREVIOUS RADIATION THERAPY: No  PAST MEDICAL HISTORY:  has a past medical history of Arthritis; Hyperlipidemia; Osteoporosis; Hypertension; GERD (gastroesophageal reflux disease); Hyperthyroidism; and Breast cancer.    PAST SURGICAL HISTORY: Past Surgical History  Procedure Laterality Date  . Abdominal hysterectomy    . Spine surgery  89  . Eye surgery Bilateral 16  . Breast lumpectomy with needle localization Left 03/04/2013    Procedure: LEFT BREAST LUMPECTOMY WITH NEEDLE LOCALIZATION;  Surgeon: Imogene Burn. Georgette Dover, MD;  Location: [facility];  Service: General;  Laterality: Left;  . Breast surgery      FAMILY HISTORY: family history includes Cancer in her mother; Lung cancer in her brother.  SOCIAL HISTORY:  reports that she has been smoking Cigarettes.  She has a 20 pack-year smoking history. She has never used smokeless tobacco. She reports that she does not drink alcohol or use illicit drugs.  ALLERGIES: Review of patient's allergies indicates no known allergies.  MEDICATIONS:  Current Outpatient Prescriptions  Medication Sig Dispense Refill  . carboxymethylcellulose (REFRESH PLUS) 0.5 % SOLN 1 drop 3 (three) times daily as needed.      . carboxymethylcellulose 1 % ophthalmic solution 1 drop at bedtime.      Marland Kitchen lisinopril-hydrochlorothiazide (PRINZIDE,ZESTORETIC) 20-12.5 MG per tablet 1 tablet daily.       Marland Kitchen HYDROcodone-acetaminophen (NORCO/VICODIN) 5-325 MG per tablet Take 1 tablet by mouth every 4 (four) hours as needed.  40 tablet  0   No current facility-administered medications for this encounter.    REVIEW OF SYSTEMS:  A 15 point review of systems is documented in the electronic medical record. This was obtained by the nursing staff. However, I reviewed this with the patient to discuss relevant findings and make appropriate changes.  Prior to biopsy the patient denied any pain in the breast area,  nipple discharge or bleeding. She denies any new bony pain headaches dizziness or blurred vision. She has chronic discomfort with arthritis.   PHYSICAL EXAM:  height is _0  (1.702 m) and weight is 90 lb (40.824 kg). Her temperature is 97.8 F (36.6 C). Her blood pressure is 166/92 and her pulse is 79.   BP 166/92  Pulse 79  Temp(Src) 97.8 F (36.6  C)  Ht _0  (1.702 m)  Wt 90 lb (40.824 kg)  BMI 14.09 kg/m2  General Appearance:    Alert, cooperative, no distress, appears stated age, accompanied by a daughter on evaluation today   Head:    Normocephalic, without obvious abnormality, atraumatic  Eyes:    PERRL,  conjunctiva/corneas clear, EOM's intact, signs of prior cataract surgery   Ears:    Normal TM's and external ear canals, both ears, hearing aid along the right   Nose:   Nares normal, septum midline, mucosa normal, no drainage    or sinus tenderness  Throat:   Lips, mucosa, and tongue normal; teeth and gums normal  Neck:   Supple, symmetrical, trachea midline, no adenopathy;    thyroid:  no enlargement/tenderness/nodules; no carotid   bruit or JVD  Back:     Symmetric, no curvature, ROM normal, no CVA tenderness  Lungs:     Clear to auscultation bilaterally, respirations unlabored  Chest Wall:    No tenderness or deformity   Heart:    Regular rate and rhythm, S1 and S2 normal, no murmur, rub   or gallop  Breast Exam:    No tenderness, masses, or nipple abnormality of the right breast;  the left breast area shows a lumpectomy scar in the 2:30 position. There is some induration in this area and mild swelling. No signs of infection. No dominant mass appreciated in the breast nipple discharge or bleeding.   Abdomen:     Soft, non-tender, bowel sounds active all four quadrants,    no masses, no organomegaly        Extremities:   Extremities normal, atraumatic, no cyanosis or edema  Pulses:   2+ and symmetric all extremities  Skin:   Skin color, texture, turgor normal, no rashes or lesions  Lymph nodes:   Cervical, supraclavicular, and axillary nodes normal  Neurologic:   normal strength, sensation and reflexes    throughout    ECOG = 0  0 - Asymptomatic (Fully active, able to carry on all predisease activities without restriction)   LABORATORY DATA:  Lab Results  Component Value Date   WBC 7.0 03/02/2013   HGB 14.8 03/02/2013   HCT 43.4 03/02/2013   MCV 92.3 03/02/2013   PLT 178 03/02/2013   Lab Results  Component Value Date   NA 138 03/02/2013   K 3.8 03/02/2013   CL 101 03/02/2013   CO2 27 03/02/2013   No results found for this basename: ALT, AST, GGT, ALKPHOS, BILITOT       RADIOGRAPHY: Mm Lt Plc Breast Loc Dev   1st Lesion  Inc Mammo Guide  03/04/2013   CLINICAL DATA:  Indeterminate left breast calcifications at recent mammography. The patient's breast is too thin to undergo stereotactic guided core needle biopsy.  EXAM: NEEDLE LOCALIZATION OF THE LEFT BREAST WITH MAMMO GUIDANCE  COMPARISON:  Previous exams.  FINDINGS: Patient presents for needle localization prior to excisional biopsy. I met with the patient and we discussed the procedure of needle localization including benefits and alternatives. We discussed the high likelihood of a successful procedure. We discussed the risks of the procedure, including infection, bleeding, tissue injury, and further surgery. Informed, written consent was given. The usual time-out protocol was performed immediately prior to the procedure.  Using mammographic guidance, sterile technique, 2% lidocaine and a 5 cm modified Kopans needle, the recently demonstrated 5 mm group of microcalcifications in the outer left breast was localized using lateral approach. The films  were marked for Dr. Georgette Dover.  Specimen radiograph was performed at Day Surgery and confirms the calcifications and wire tip present in the tissue sample. The specimen was marked for pathology.  IMPRESSION: Needle localization left breast. No apparent complications.   Electronically Signed   By: Enrique Sack M.D.   On: 03/04/2013 15:12       IMPRESSION: High-grade intraductal carcinoma of the left breast. Given the high-grade nature of the lesion as well as the fact that the patient would not benefit from adjuvant hormonal therapy, I would recommend radiation therapy in this situation. I discussed the treatment course side effects and potential toxicities of radiation therapy with the patient and her daughter. Patient appears to understand and wishes to proceed with planned course of treatment.  PLAN: Simulation and planning early next week. Patient will likely be treated with  hypofractionated accelerated radiation therapy. I spent 60 minutes minutes face to face with the patient and more than 50% of that time was spent in counseling and/or coordination of care.   ------------------------------------------------

## 2013-04-01 NOTE — Progress Notes (Signed)
Please see the Nurse Progress Note in the MD Initial Consult Encounter for this patient. 

## 2013-04-07 ENCOUNTER — Encounter: Payer: Self-pay | Admitting: Radiation Oncology

## 2013-04-07 ENCOUNTER — Ambulatory Visit
Admission: RE | Admit: 2013-04-07 | Discharge: 2013-04-07 | Disposition: A | Discharge status: Home / Self Care | Payer: Medicare Other | Source: Ambulatory Visit | Attending: Radiation Oncology | Admitting: Radiation Oncology

## 2013-04-07 DIAGNOSIS — D0512 Intraductal carcinoma in situ of left breast: Secondary | ICD-10-CM

## 2013-04-07 DIAGNOSIS — L589 Radiodermatitis, unspecified: Secondary | ICD-10-CM | POA: Insufficient documentation

## 2013-04-07 DIAGNOSIS — Z510 Encounter for antineoplastic radiation therapy: Secondary | ICD-10-CM | POA: Insufficient documentation

## 2013-04-07 DIAGNOSIS — Z79899 Other long term (current) drug therapy: Secondary | ICD-10-CM | POA: Insufficient documentation

## 2013-04-07 DIAGNOSIS — C50919 Malignant neoplasm of unspecified site of unspecified female breast: Secondary | ICD-10-CM | POA: Insufficient documentation

## 2013-04-07 DIAGNOSIS — Y842 Radiological procedure and radiotherapy as the cause of abnormal reaction of the patient, or of later complication, without mention of misadventure at the time of the procedure: Secondary | ICD-10-CM | POA: Insufficient documentation

## 2013-04-07 NOTE — Progress Notes (Signed)
Spoke with patient today about RadOnc billing, I explained the [employer] program to her and told her that she would be billed at the end of her treatments and payments could be set up at that time.

## 2013-04-08 NOTE — Addendum Note (Signed)
Encounter addended by: Blair Promise, MD on: 04/08/2013  6:54 PM<BR>     Documentation filed: Notes Section

## 2013-04-08 NOTE — Progress Notes (Signed)
  Radiation Oncology         [phone] ________________________________  Name: Rhonda Reynolds MRN: [id number]  Date: 04/07/2013  DOB: [birth year]  SIMULATION AND TREATMENT PLANNING NOTE  DIAGNOSIS:  high-grade intraductal carcinoma of the left breast   NARRATIVE:  The patient was brought to the [facility].  Identity was confirmed.  All relevant records and images related to the planned course of therapy were reviewed.  The patient freely provided informed written consent to proceed with treatment after reviewing the details related to the planned course of therapy. The consent form was witnessed and verified by the simulation staff.  Then, the patient was set-up in a stable reproducible  supine position for radiation therapy.  CT images were obtained.  Surface markings were placed.  The CT images were loaded into the planning software.  Then the target and avoidance structures were contoured.  Treatment planning then occurred.  The radiation prescription was entered and confirmed.  Then, I designed and supervised the construction of a total of 3 medically necessary complex treatment devices.  I have requested : 3D Simulation  I have requested a DVH of the following structures: heart, lungs, lumpectomy cavity.  I have ordered:dose calc.  PLAN:  The patient will receive 42.5 Gy in 17 fractions followed by a boost to the lumpectomy cavity for a cumulative dose of 50 gray..  ________________________________  -----------------------------------  Blair Promise, PhD, MD

## 2013-04-08 NOTE — Progress Notes (Signed)
  Radiation Oncology         [phone]) [phone] ________________________________  Name: Rhonda Reynolds MRN: [id number]  Date: 04/07/2013  DOB: [birth year]  Optical Surface Tracking Plan:  Since intensity modulated radiotherapy (IMRT) and 3D conformal radiation treatment methods are predicated on accurate and precise positioning for treatment, intrafraction motion monitoring is medically necessary to ensure accurate and safe treatment delivery.  The ability to quantify intrafraction motion without excessive ionizing radiation dose can only be performed with optical surface tracking. Accordingly, surface imaging offers the opportunity to obtain 3D measurements of patient position throughout IMRT and 3D treatments without excessive radiation exposure.  I am ordering optical surface tracking for this patient's upcoming course of radiotherapy. ________________________________  Blair Promise, MD 04/08/2013 6:52 PM    Reference:   Ursula Alert, J, et al. Surface imaging-based analysis of intrafraction motion for breast radiotherapy patients.Journal of [place], n. 6, nov. 2014. ISSN [id number].   Available at: <http://www.jacmp.org/index.php/jacmp/article/view/4957>.

## 2013-04-14 ENCOUNTER — Ambulatory Visit
Admission: RE | Admit: 2013-04-14 | Discharge: 2013-04-14 | Disposition: A | Discharge status: Home / Self Care | Payer: Medicare Other | Source: Ambulatory Visit | Attending: Radiation Oncology | Admitting: Radiation Oncology

## 2013-04-14 DIAGNOSIS — D0512 Intraductal carcinoma in situ of left breast: Secondary | ICD-10-CM

## 2013-04-14 NOTE — Progress Notes (Signed)
  Radiation Oncology         [phone] ________________________________  Name: Rhonda Reynolds MRN: [id number]  Date: 04/14/2013  DOB: Dec 31, 1934  Simulation Verification Note  Status: outpatient  NARRATIVE: The patient was brought to the treatment unit and placed in the planned treatment position. The clinical setup was verified. Then port films were obtained and uploaded to the radiation oncology medical record software.  The treatment beams were carefully compared against the planned radiation fields. The position location and shape of the radiation fields was reviewed. They targeted volume of tissue appears to be appropriately covered by the radiation beams. Organs at risk appear to be excluded as planned.  Based on my personal review, I approved the simulation verification. The patient's treatment will proceed as planned.  -----------------------------------  Blair Promise, PhD, MD

## 2013-04-15 ENCOUNTER — Ambulatory Visit
Admission: RE | Admit: 2013-04-15 | Discharge: 2013-04-15 | Disposition: A | Discharge status: Home / Self Care | Payer: Medicare Other | Source: Ambulatory Visit | Attending: Radiation Oncology | Admitting: Radiation Oncology

## 2013-04-16 ENCOUNTER — Ambulatory Visit
Admission: RE | Admit: 2013-04-16 | Discharge: 2013-04-16 | Disposition: A | Discharge status: Home / Self Care | Payer: Medicare Other | Source: Ambulatory Visit | Attending: Radiation Oncology | Admitting: Radiation Oncology

## 2013-04-17 ENCOUNTER — Ambulatory Visit
Admission: RE | Admit: 2013-04-17 | Discharge: 2013-04-17 | Disposition: A | Discharge status: Home / Self Care | Payer: Medicare Other | Source: Ambulatory Visit | Attending: Radiation Oncology | Admitting: Radiation Oncology

## 2013-04-17 DIAGNOSIS — D0512 Intraductal carcinoma in situ of left breast: Secondary | ICD-10-CM

## 2013-04-17 MED ORDER — ALRA NON-METALLIC DEODORANT (RAD-ONC)
1.0000 "application " | Freq: Once | TOPICAL | Status: AC
  Administered 2013-04-17: 1 via TOPICAL

## 2013-04-17 MED ORDER — RADIAPLEXRX EX GEL
Freq: Once | CUTANEOUS | Status: AC
  Administered 2013-04-17: 10:00:00 via TOPICAL

## 2013-04-17 NOTE — Progress Notes (Signed)
Rhonda Reynolds here for post sim education.  She was given the Radiation Therapy and You book and the skin care handout.  Discussed the potential side effects including fatigue, hair loss, pain and skin changes.  She was given Alra Deoderant and Radiaplex gel.  She was advised to apply the radiaplex after treatment and at bedtime.  She stated that she usually uses Vaseline for dry skin.  Advised her to stop using Vaseline on her left side/breast.  She was educated about PUT day with Dr. Sondra Come on Tuesday's.  She was advised to call nursing with any questions or concerns.

## 2013-04-20 ENCOUNTER — Ambulatory Visit
Admission: RE | Admit: 2013-04-20 | Discharge: 2013-04-20 | Disposition: A | Discharge status: Home / Self Care | Payer: Medicare Other | Source: Ambulatory Visit | Attending: Radiation Oncology | Admitting: Radiation Oncology

## 2013-04-21 ENCOUNTER — Ambulatory Visit
Admission: RE | Admit: 2013-04-21 | Discharge: 2013-04-21 | Disposition: A | Discharge status: Home / Self Care | Payer: Medicare Other | Source: Ambulatory Visit | Attending: Radiation Oncology | Admitting: Radiation Oncology

## 2013-04-21 VITALS — BP 143/87 | HR 76 | Temp 97.5°F | Ht 67.0 in | Wt 86.0 lb

## 2013-04-21 DIAGNOSIS — D0512 Intraductal carcinoma in situ of left breast: Secondary | ICD-10-CM

## 2013-04-21 NOTE — Progress Notes (Signed)
Rhonda Reynolds has had 5 fractions to her left breast.  She denies pain except for occasional dull pain in her left breast.  She has lost 4 lbs since 04/01/13.  She reports that she has always had a poor appetitie.  She does not want to see a dietician at this time.  The skin on her left breast is intact.  She is using radiaplex gel.

## 2013-04-21 NOTE — Progress Notes (Signed)
[facility]     Rexene Edison, M.D. [place], [place] [address]               Blair Promise, M.D., Ph.D. Phone: [phone]      [place] A. Tammi Klippel, M.D. Fax: [phone]      Jodelle Gross, M.D., Ph.D.         Thea Silversmith, M.D.         Wyvonnia Lora, M.D Weekly Treatment Management Note  Name: Rhonda Reynolds     MRN: [id number]        CSN: [id number] Date: 04/21/2013      DOB: 12/27/1934  CC: Maximino Greenland, MD         [facility]    Status: Outpatient  Diagnosis  high-grade intraductal carcinoma of the left breast  Current Dose: 12.5 Gy  Current Fraction: 5  Planned Dose: 50 GY  Narrative: [name] was seen today for weekly treatment management. The chart was checked and port films  were reviewed. She is tolerating the treatments well at this time. She has noticed some mild sensitivity within breast after her treatment.  Review of patient's allergies indicates no known allergies.  Current Outpatient Prescriptions  Medication Sig Dispense Refill  . carboxymethylcellulose (REFRESH PLUS) 0.5 % SOLN 1 drop 3 (three) times daily as needed.      . carboxymethylcellulose 1 % ophthalmic solution 1 drop at bedtime.      . hyaluronate sodium (RADIAPLEXRX) GEL Apply 1 application topically 2 (two) times daily.      Marland Kitchen lisinopril-hydrochlorothiazide (PRINZIDE,ZESTORETIC) 20-12.5 MG per tablet 1 tablet daily.       . non-metallic deodorant Jethro Poling) MISC Apply 1 application topically daily as needed.      Marland Kitchen HYDROcodone-acetaminophen (NORCO/VICODIN) 5-325 MG per tablet Take 1 tablet by mouth every 4 (four) hours as needed.  40 tablet  0   No current facility-administered medications for this encounter.   Labs:  Lab Results  Component Value Date   WBC 7.0 03/02/2013   HGB 14.8 03/02/2013   HCT 43.4 03/02/2013   MCV 92.3 03/02/2013   PLT 178 03/02/2013   Lab Results  Component Value Date   CREATININE 0.60 03/02/2013    BUN 10 03/02/2013   NA 138 03/02/2013   K 3.8 03/02/2013   CL 101 03/02/2013   CO2 27 03/02/2013   No results found for this basename: ALT, AST, GGT, ALK, PHOS, BILITOT    Physical Examination:  Filed Vitals:   04/21/13 0920  BP: 143/87  Pulse: 76  Temp: 97.5 F (36.4 C)    Wt Readings from Last 3 Encounters:  04/21/13 86 lb (39.009 kg)  04/01/13 90 lb (40.824 kg)  03/02/13 85 lb 9.6 oz (38.828 kg)    The left breast area shows some mild hyperpigmentation changes. No signs of drainage or infection Lungs - Normal respiratory effort, chest expands symmetrically. Lungs are clear to auscultation, no crackles or wheezes.  Heart has regular rhythm and rate  Abdomen is soft and non tender with normal bowel sounds  Assessment:  Patient tolerating treatments well  Plan: Continue treatment per original radiation prescription

## 2013-04-22 ENCOUNTER — Ambulatory Visit
Admission: RE | Admit: 2013-04-22 | Discharge: 2013-04-22 | Disposition: A | Discharge status: Home / Self Care | Payer: Medicare Other | Source: Ambulatory Visit | Attending: Radiation Oncology | Admitting: Radiation Oncology

## 2013-04-23 ENCOUNTER — Ambulatory Visit
Admission: RE | Admit: 2013-04-23 | Discharge: 2013-04-23 | Disposition: A | Discharge status: Home / Self Care | Payer: Medicare Other | Source: Ambulatory Visit | Attending: Radiation Oncology | Admitting: Radiation Oncology

## 2013-04-24 ENCOUNTER — Ambulatory Visit
Admission: RE | Admit: 2013-04-24 | Discharge: 2013-04-24 | Disposition: A | Discharge status: Home / Self Care | Payer: Medicare Other | Source: Ambulatory Visit | Attending: Radiation Oncology | Admitting: Radiation Oncology

## 2013-04-24 ENCOUNTER — Telehealth: Payer: Self-pay | Admitting: Dietician

## 2013-04-24 NOTE — Telephone Encounter (Signed)
Brief Outpatient Oncology Nutrition Note  Patient has been identified to be at risk on malnutrition screen.  Wt Readings from Last 10 Encounters:  04/21/13 86 lb (39.009 kg)  04/01/13 90 lb (40.824 kg)  03/02/13 85 lb 9.6 oz (38.828 kg)  02/04/13 85 lb (38.556 kg)    Chart reviewed.  Per chart, patient with a poor appetite usually and does not want to see a dietitian at this time.    [place] RD available as needed.    Antonieta Iba, RD, LDN

## 2013-04-27 ENCOUNTER — Ambulatory Visit
Admission: RE | Admit: 2013-04-27 | Discharge: 2013-04-27 | Disposition: A | Discharge status: Home / Self Care | Payer: Medicare Other | Source: Ambulatory Visit | Attending: Radiation Oncology | Admitting: Radiation Oncology

## 2013-04-28 ENCOUNTER — Encounter: Payer: Self-pay | Admitting: Radiation Oncology

## 2013-04-28 ENCOUNTER — Ambulatory Visit
Admission: RE | Admit: 2013-04-28 | Discharge: 2013-04-28 | Disposition: A | Discharge status: Home / Self Care | Payer: Medicare Other | Source: Ambulatory Visit | Attending: Radiation Oncology | Admitting: Radiation Oncology

## 2013-04-28 VITALS — BP 151/85 | HR 71 | Temp 97.3°F | Resp 20 | Wt 88.9 lb

## 2013-04-28 DIAGNOSIS — D0512 Intraductal carcinoma in situ of left breast: Secondary | ICD-10-CM

## 2013-04-28 NOTE — Progress Notes (Addendum)
Pt denies pain, fatigue, states he appetite comes and goes. Pt lives alone; discussed sources of protein and reminded her of page in "Radiation and You:" booklet w/list of foods high in protein. Pt spoke w/dietician on 04/24/13. Pt is applying Radiaplex to left breast treatment area.

## 2013-04-28 NOTE — Progress Notes (Signed)
  Radiation Oncology         [phone] ________________________________  Name: Rhonda Reynolds MRN: [id number]  Date: 04/28/2013  DOB: 16-Jun-1934  Weekly Radiation Therapy Management  Current Dose: 25 Gy     Planned Dose:  50 Gy  Narrative . . . . . . . . The patient presents for routine under treatment assessment.                                   The patient is without complaint.                                 Set-up films were reviewed.                                 The chart was checked. Physical Findings. . .  weight is 88 lb 14.4 oz (40.325 kg). Her temperature is 97.3 F (36.3 C). Her blood pressure is 151/85 and her pulse is 71. Her respiration is 20. . Weight essentially stable.  No significant changes. Mild hyperpigmentation changes in the left breast. Impression . . . . . . . The patient is tolerating radiation. Plan . . . . . . . . . . . . Continue treatment as planned.  ________________________________  -----------------------------------  Blair Promise, PhD, MD

## 2013-04-29 ENCOUNTER — Ambulatory Visit
Admission: RE | Admit: 2013-04-29 | Discharge: 2013-04-29 | Disposition: A | Discharge status: Home / Self Care | Payer: Medicare Other | Source: Ambulatory Visit | Attending: Radiation Oncology | Admitting: Radiation Oncology

## 2013-04-30 ENCOUNTER — Ambulatory Visit
Admission: RE | Admit: 2013-04-30 | Discharge: 2013-04-30 | Disposition: A | Discharge status: Home / Self Care | Payer: Medicare Other | Source: Ambulatory Visit | Attending: Radiation Oncology | Admitting: Radiation Oncology

## 2013-05-01 ENCOUNTER — Ambulatory Visit
Admission: RE | Admit: 2013-05-01 | Discharge: 2013-05-01 | Disposition: A | Discharge status: Home / Self Care | Payer: Medicare Other | Source: Ambulatory Visit | Attending: Radiation Oncology | Admitting: Radiation Oncology

## 2013-05-04 ENCOUNTER — Ambulatory Visit
Admission: RE | Admit: 2013-05-04 | Discharge: 2013-05-04 | Disposition: A | Discharge status: Home / Self Care | Payer: Medicare Other | Source: Ambulatory Visit | Attending: Radiation Oncology | Admitting: Radiation Oncology

## 2013-05-05 ENCOUNTER — Ambulatory Visit: Admission: RE | Admit: 2013-05-05 | Payer: Medicare Other | Source: Ambulatory Visit | Admitting: Radiation Oncology

## 2013-05-05 ENCOUNTER — Ambulatory Visit
Admission: RE | Admit: 2013-05-05 | Discharge: 2013-05-05 | Disposition: A | Discharge status: Home / Self Care | Payer: Medicare Other | Source: Ambulatory Visit | Attending: Radiation Oncology | Admitting: Radiation Oncology

## 2013-05-05 VITALS — BP 120/79 | HR 69 | Temp 97.5°F | Ht 67.0 in | Wt 89.3 lb

## 2013-05-05 DIAGNOSIS — D0512 Intraductal carcinoma in situ of left breast: Secondary | ICD-10-CM

## 2013-05-05 NOTE — Progress Notes (Signed)
  Radiation Oncology         [phone] ________________________________  Name: ALANNI VADER MRN: [id number]  Date: 05/05/2013  DOB: 28-Aug-1934  Weekly Radiation Therapy Management  Current Dose: 37.5 Gy     Planned Dose:  50 Gy  Narrative . . . . . . . . The patient presents for routine under treatment assessment.                                   The patient is without complaint.                                 Set-up films were reviewed.                                 The chart was checked. Physical Findings. . .  height is 5\' 7"  (1.702 m) and weight is 89 lb 4.8 oz (40.506 kg). Her temperature is 97.5 F (36.4 C). Her blood pressure is 120/79 and her pulse is 69. . Weight essentially stable.  No significant changes. Minimal hyperpigmentation changes within the left breast. Impression . . . . . . . The patient is tolerating radiation. Plan . . . . . . . . . . . . Continue treatment as planned.  ________________________________   Blair Promise, PhD, MD

## 2013-05-05 NOTE — Progress Notes (Signed)
Rhonda Reynolds has had 15 fractions to her left breast.  She denies pain.  She does have a twinge of deep pain in her left breast about once a day along her incision.  She denies fatigue.  The skin on her left breast is intact with hyperpigmentation.  She is using radiaplex twice a day.

## 2013-05-06 ENCOUNTER — Ambulatory Visit
Admission: RE | Admit: 2013-05-06 | Discharge: 2013-05-06 | Disposition: A | Discharge status: Home / Self Care | Payer: Medicare Other | Source: Ambulatory Visit | Attending: Radiation Oncology | Admitting: Radiation Oncology

## 2013-05-06 ENCOUNTER — Encounter: Payer: Self-pay | Admitting: Radiation Oncology

## 2013-05-06 NOTE — Progress Notes (Signed)
   Department of Radiation Oncology  Phone:  [phone] Fax:        [phone]  Electron beam simulation note  Today patient underwent additional planning for radiation therapy recommended the left breast area. Patient's treatment planning CT scan was reviewed and she had set up of a custom electron cutout field directed at the lumpectomy cavity within the breast area. Patient will be treated with 6 MeV electrons prescribed to the 90% isodose line. The patient will receive 3 treatments at 2.5 gray per treatment for a boost dose of 7.5 gray. A special port plan is requested for treatment.  -----------------------------------  Blair Promise, PhD, MD

## 2013-05-07 ENCOUNTER — Ambulatory Visit
Admission: RE | Admit: 2013-05-07 | Discharge: 2013-05-07 | Disposition: A | Discharge status: Home / Self Care | Payer: Medicare Other | Source: Ambulatory Visit | Attending: Radiation Oncology | Admitting: Radiation Oncology

## 2013-05-07 DIAGNOSIS — D0512 Intraductal carcinoma in situ of left breast: Secondary | ICD-10-CM

## 2013-05-08 ENCOUNTER — Ambulatory Visit
Admission: RE | Admit: 2013-05-08 | Discharge: 2013-05-08 | Disposition: A | Discharge status: Home / Self Care | Payer: Medicare Other | Source: Ambulatory Visit | Attending: Radiation Oncology | Admitting: Radiation Oncology

## 2013-05-11 ENCOUNTER — Ambulatory Visit
Admission: RE | Admit: 2013-05-11 | Discharge: 2013-05-11 | Disposition: A | Discharge status: Home / Self Care | Payer: Medicare Other | Source: Ambulatory Visit | Attending: Radiation Oncology | Admitting: Radiation Oncology

## 2013-05-12 ENCOUNTER — Ambulatory Visit
Admission: RE | Admit: 2013-05-12 | Discharge: 2013-05-12 | Disposition: A | Discharge status: Home / Self Care | Payer: Medicare Other | Source: Ambulatory Visit | Attending: Radiation Oncology | Admitting: Radiation Oncology

## 2013-05-12 ENCOUNTER — Encounter: Payer: Self-pay | Admitting: Radiation Oncology

## 2013-05-12 VITALS — BP 143/78 | HR 71 | Temp 97.8°F | Ht 67.0 in | Wt 88.4 lb

## 2013-05-12 DIAGNOSIS — D0512 Intraductal carcinoma in situ of left breast: Secondary | ICD-10-CM

## 2013-05-12 MED ORDER — RADIAPLEXRX EX GEL
Freq: Once | CUTANEOUS | Status: AC
  Administered 2013-05-12: 16:00:00 via TOPICAL

## 2013-05-12 NOTE — Progress Notes (Signed)
  Radiation Oncology         [phone] ________________________________  Name: Rhonda Reynolds MRN: [id number]  Date: 05/12/2013  DOB: Jul 09, 1934  Weekly Radiation Therapy Management  Current Dose: 50 Gy     Planned Dose:  50 Gy  Narrative . . . . . . . . The patient presents for routine under treatment assessment.                                   The patient is without complaint.  She is happy to complete her radiation therapy today                                 Set-up films were reviewed.                                 The chart was checked. Physical Findings. . .  height is 5\' 7"  (1.702 m) and weight is 88 lb 6.4 oz (40.098 kg). Her temperature is 97.8 F (36.6 C). Her blood pressure is 143/78 and her pulse is 71. . Weight essentially stable.  Hyperpigmentation changes noted in the breast and mild radiation dermatitis Impression . . . . . . . The patient is tolerating radiation. Plan . . . . . . . . . . . Marland Kitchen routine followup in one month.  ________________________________ -----------------------------------  Blair Promise, PhD, MD

## 2013-05-12 NOTE — Addendum Note (Signed)
Encounter addended by: Jacqulyn Liner, RN on: 05/12/2013  3:32 PM<BR>     Documentation filed: Inpatient MAR

## 2013-05-12 NOTE — Progress Notes (Signed)
  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   She denies any fatigue or significant breast discomfort. Her skin showed some hyperpigmentation changes but no skin breakdown  Plan: The patient has completed radiation treatment. The patient will return to radiation oncology clinic for routine followup in one month. I advised them to call or return sooner if they have any questions or concerns related to their recovery or treatment.  -----------------------------------  Blair Promise, PhD, MD

## 2013-05-12 NOTE — Progress Notes (Signed)
Rhonda Reynolds has had 20 fractions to her left breast.  She denies pan except for occasional dull pain in her left breast that lasts for a second and is gone.  She denies fatigue.  The skin on her left breast is intact with hyperpigmentation.  She is using radiaplex gel and needs a refill.  Another tube has been given.

## 2013-05-12 NOTE — Addendum Note (Signed)
Encounter addended by: Jacqulyn Liner, RN on: 05/12/2013  3:31 PM<BR>     Documentation filed: Orders

## 2013-06-02 ENCOUNTER — Ambulatory Visit: Payer: Medicare Other | Admitting: Family

## 2013-06-10 ENCOUNTER — Encounter: Payer: Self-pay | Admitting: Oncology

## 2013-06-11 ENCOUNTER — Ambulatory Visit
Admission: RE | Admit: 2013-06-11 | Discharge: 2013-06-11 | Disposition: A | Discharge status: Home / Self Care | Payer: Medicare Other | Source: Ambulatory Visit | Attending: Radiation Oncology | Admitting: Radiation Oncology

## 2013-06-11 ENCOUNTER — Encounter: Payer: Self-pay | Admitting: Radiation Oncology

## 2013-06-11 VITALS — BP 152/91 | HR 78 | Temp 97.9°F | Ht 67.0 in | Wt 88.5 lb

## 2013-06-11 DIAGNOSIS — D0512 Intraductal carcinoma in situ of left breast: Secondary | ICD-10-CM

## 2013-06-11 NOTE — Progress Notes (Signed)
Rhonda Reynolds here for follow up after treatment to her left breast.  She denies pain.  She reports that she is weak but not fatigued.  The skin on her left breast is intact with hyperpigmentation.  She is using radiaplex gel.

## 2013-06-11 NOTE — Progress Notes (Signed)
  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  She is doing well at this time. She has some very mild fatigue but otherwise is doing well. She denies any itching or discomfort in the breast area. She denies any nipple discharge or bleeding.                              ALLERGIES:  has No Known Allergies.  Meds: Current Outpatient Prescriptions  Medication Sig Dispense Refill  . carboxymethylcellulose (REFRESH PLUS) 0.5 % SOLN 1 drop 3 (three) times daily as needed.      . carboxymethylcellulose 1 % ophthalmic solution 1 drop at bedtime.      . cholecalciferol (VITAMIN D) 1000 UNITS tablet Take 3,000 Units by mouth daily.      . hyaluronate sodium (RADIAPLEXRX) GEL Apply 1 application topically 2 (two) times daily.      Marland Kitchen lisinopril-hydrochlorothiazide (PRINZIDE,ZESTORETIC) 20-12.5 MG per tablet 1 tablet daily.       . non-metallic deodorant Jethro Poling) MISC Apply 1 application topically daily as needed.       No current facility-administered medications for this encounter.    Physical Findings: The patient is in no acute distress. Patient is alert and oriented.  height is 5\' 7"  (1.702 m) and weight is 88 lb 8 oz (40.143 kg). Her temperature is 97.9 F (36.6 C). Her blood pressure is 152/91 and her pulse is 78. Marland Kitchen  No palpable supraclavicular or axillary adenopathy. The lungs are clear to auscultation. The heart has a regular rhythm and rate. Examination right breast reveals no mass or nipple discharge. Examination of the left breast reveals hyperpigmentation changes. There some mild induration at the lumpectomy scar but no dominant masses appreciated  breast. No nipple discharge or bleeding.  Lab Findings: Lab Results  Component Value Date   WBC 7.0 03/02/2013   HGB 14.8 03/02/2013   HCT 43.4 03/02/2013   MCV 92.3 03/02/2013   PLT 178 03/02/2013     Radiographic Findings: No results found.  Impression:  The patient is recovering from the effects of radiation.  No evidence of recurrence on clinical exam today.  Plan:  Routine followup in 6 months.  ____________________________________ Blair Promise, MD

## 2013-06-19 ENCOUNTER — Ambulatory Visit (INDEPENDENT_AMBULATORY_CARE_PROVIDER_SITE_OTHER): Payer: Medicare Other | Admitting: Surgery

## 2013-06-19 ENCOUNTER — Encounter (INDEPENDENT_AMBULATORY_CARE_PROVIDER_SITE_OTHER): Payer: Self-pay | Admitting: Surgery

## 2013-06-19 VITALS — BP 144/88 | HR 80 | Temp 97.2°F | Resp 14 | Wt 89.2 lb

## 2013-06-19 DIAGNOSIS — D0512 Intraductal carcinoma in situ of left breast: Secondary | ICD-10-CM

## 2013-06-19 DIAGNOSIS — D0590 Unspecified type of carcinoma in situ of unspecified breast: Secondary | ICD-10-CM

## 2013-06-19 NOTE — Progress Notes (Signed)
Status post left lumpectomy in December of 2014 for high-grade DCIS. The patient has completed her radiation therapy. She has some skin thickening and scarring around the left lumpectomy site. No other new masses.  Filed Vitals:   06/19/13 0908  BP: 144/88  Pulse: 80  Temp: 97.2 F (36.2 C)  Resp: 14   No new palpable masses in either breast. No axillary lymphadenopathy. Original easily palpable through her very thickened breast tissue. She has some thickening of the skin overlying her left lumpectomy incision.  We will recheck her in 3 months. Hopefully the radiation thickening has decreased. No other treatment indicated at this time.  Imogene Burn. Georgette Dover, MD, [facility] Surgery  General/ Trauma Surgery  06/19/2013 9:44 AM

## 2013-08-13 ENCOUNTER — Ambulatory Visit: Payer: Medicare Other | Admitting: Internal Medicine

## 2013-09-11 ENCOUNTER — Ambulatory Visit (INDEPENDENT_AMBULATORY_CARE_PROVIDER_SITE_OTHER): Payer: Medicare Other | Admitting: Surgery

## 2013-09-11 ENCOUNTER — Encounter (INDEPENDENT_AMBULATORY_CARE_PROVIDER_SITE_OTHER): Payer: Self-pay | Admitting: Surgery

## 2013-09-11 VITALS — BP 124/70 | HR 75 | Temp 98.6°F | Ht 66.0 in | Wt 85.0 lb

## 2013-09-11 DIAGNOSIS — D0590 Unspecified type of carcinoma in situ of unspecified breast: Secondary | ICD-10-CM

## 2013-09-11 DIAGNOSIS — D0512 Intraductal carcinoma in situ of left breast: Secondary | ICD-10-CM

## 2013-09-11 NOTE — Progress Notes (Signed)
Status post left lumpectomy in December of 2014 for high-grade DCIS. The patient has completed her radiation therapy. She has some skin thickening and scarring around the left lumpectomy site, but this is decreased since the last visit in March.   Filed Vitals:   09/11/13 1001  BP: 124/70  Pulse: 75  Temp: 98.6 F (37 C)     No new palpable masses in either breast. No axillary lymphadenopathy. Her ribs are easily palpable through her mildly  thickened breast tissue. She has some thickening of the skin overlying her left lumpectomy incision.   We will recheck her in 6 months.  No other treatment indicated at this time.  Imogene Burn. Georgette Dover, MD, [facility] Surgery  General/ Trauma Surgery  09/11/2013 11:32 AM

## 2014-04-05 ENCOUNTER — Other Ambulatory Visit (INDEPENDENT_AMBULATORY_CARE_PROVIDER_SITE_OTHER): Payer: Self-pay | Admitting: Surgery

## 2014-04-05 ENCOUNTER — Ambulatory Visit
Admission: RE | Admit: 2014-04-05 | Discharge: 2014-04-05 | Disposition: A | Discharge status: Home / Self Care | Payer: Medicare Other | Source: Ambulatory Visit | Attending: Surgery | Admitting: Surgery

## 2014-04-05 DIAGNOSIS — Z853 Personal history of malignant neoplasm of breast: Secondary | ICD-10-CM | POA: Diagnosis not present

## 2014-04-05 DIAGNOSIS — D0512 Intraductal carcinoma in situ of left breast: Secondary | ICD-10-CM

## 2014-06-09 DIAGNOSIS — E1329 Other specified diabetes mellitus with other diabetic kidney complication: Secondary | ICD-10-CM | POA: Diagnosis not present

## 2014-06-09 DIAGNOSIS — M810 Age-related osteoporosis without current pathological fracture: Secondary | ICD-10-CM | POA: Diagnosis not present

## 2014-06-09 DIAGNOSIS — I10 Essential (primary) hypertension: Secondary | ICD-10-CM | POA: Diagnosis not present

## 2014-06-14 DIAGNOSIS — N183 Chronic kidney disease, stage 3 (moderate): Secondary | ICD-10-CM | POA: Diagnosis not present

## 2014-06-14 DIAGNOSIS — I10 Essential (primary) hypertension: Secondary | ICD-10-CM | POA: Diagnosis not present

## 2014-06-14 DIAGNOSIS — F17210 Nicotine dependence, cigarettes, uncomplicated: Secondary | ICD-10-CM | POA: Diagnosis not present

## 2014-06-14 DIAGNOSIS — E1122 Type 2 diabetes mellitus with diabetic chronic kidney disease: Secondary | ICD-10-CM | POA: Diagnosis not present

## 2014-06-14 DIAGNOSIS — M810 Age-related osteoporosis without current pathological fracture: Secondary | ICD-10-CM | POA: Diagnosis not present

## 2015-01-11 DIAGNOSIS — I129 Hypertensive chronic kidney disease with stage 1 through stage 4 chronic kidney disease, or unspecified chronic kidney disease: Secondary | ICD-10-CM | POA: Diagnosis not present

## 2015-01-11 DIAGNOSIS — E441 Mild protein-calorie malnutrition: Secondary | ICD-10-CM | POA: Diagnosis not present

## 2015-01-11 DIAGNOSIS — F17210 Nicotine dependence, cigarettes, uncomplicated: Secondary | ICD-10-CM | POA: Diagnosis not present

## 2015-01-11 DIAGNOSIS — I739 Peripheral vascular disease, unspecified: Secondary | ICD-10-CM | POA: Diagnosis not present

## 2015-01-11 DIAGNOSIS — Z23 Encounter for immunization: Secondary | ICD-10-CM | POA: Diagnosis not present

## 2015-01-11 DIAGNOSIS — E1122 Type 2 diabetes mellitus with diabetic chronic kidney disease: Secondary | ICD-10-CM | POA: Diagnosis not present

## 2015-01-20 DIAGNOSIS — H40023 Open angle with borderline findings, high risk, bilateral: Secondary | ICD-10-CM | POA: Diagnosis not present

## 2015-02-28 ENCOUNTER — Other Ambulatory Visit: Payer: Self-pay | Admitting: Internal Medicine

## 2015-02-28 DIAGNOSIS — Z853 Personal history of malignant neoplasm of breast: Secondary | ICD-10-CM

## 2015-04-07 ENCOUNTER — Other Ambulatory Visit: Payer: Self-pay | Admitting: Internal Medicine

## 2015-04-07 ENCOUNTER — Ambulatory Visit
Admission: RE | Admit: 2015-04-07 | Discharge: 2015-04-07 | Disposition: A | Discharge status: Home / Self Care | Payer: Medicare Other | Source: Ambulatory Visit | Attending: Internal Medicine | Admitting: Internal Medicine

## 2015-04-07 DIAGNOSIS — Z853 Personal history of malignant neoplasm of breast: Secondary | ICD-10-CM

## 2015-04-07 DIAGNOSIS — R922 Inconclusive mammogram: Secondary | ICD-10-CM | POA: Diagnosis not present

## 2015-04-08 DIAGNOSIS — H04123 Dry eye syndrome of bilateral lacrimal glands: Secondary | ICD-10-CM | POA: Diagnosis not present

## 2015-04-08 DIAGNOSIS — H40023 Open angle with borderline findings, high risk, bilateral: Secondary | ICD-10-CM | POA: Diagnosis not present

## 2015-04-08 DIAGNOSIS — H01003 Unspecified blepharitis right eye, unspecified eyelid: Secondary | ICD-10-CM | POA: Diagnosis not present

## 2015-04-08 DIAGNOSIS — Z961 Presence of intraocular lens: Secondary | ICD-10-CM | POA: Diagnosis not present

## 2015-04-12 DIAGNOSIS — D0512 Intraductal carcinoma in situ of left breast: Secondary | ICD-10-CM | POA: Diagnosis not present

## 2015-04-19 DIAGNOSIS — H903 Sensorineural hearing loss, bilateral: Secondary | ICD-10-CM | POA: Diagnosis not present

## 2015-04-19 DIAGNOSIS — H9312 Tinnitus, left ear: Secondary | ICD-10-CM | POA: Diagnosis not present

## 2015-11-24 ENCOUNTER — Telehealth: Payer: Self-pay | Admitting: Internal Medicine

## 2015-11-24 NOTE — Telephone Encounter (Signed)
Rec'd from Thressa Sheller forward 28 pages to Dr. Quay Burow

## 2015-12-18 ENCOUNTER — Encounter: Payer: Self-pay | Admitting: Internal Medicine

## 2015-12-18 DIAGNOSIS — I10 Essential (primary) hypertension: Secondary | ICD-10-CM | POA: Insufficient documentation

## 2015-12-18 DIAGNOSIS — E119 Type 2 diabetes mellitus without complications: Secondary | ICD-10-CM | POA: Insufficient documentation

## 2015-12-18 DIAGNOSIS — M810 Age-related osteoporosis without current pathological fracture: Secondary | ICD-10-CM | POA: Insufficient documentation

## 2015-12-18 DIAGNOSIS — I739 Peripheral vascular disease, unspecified: Secondary | ICD-10-CM | POA: Insufficient documentation

## 2015-12-18 DIAGNOSIS — N183 Chronic kidney disease, stage 3 unspecified: Secondary | ICD-10-CM | POA: Insufficient documentation

## 2015-12-18 DIAGNOSIS — Z853 Personal history of malignant neoplasm of breast: Secondary | ICD-10-CM | POA: Insufficient documentation

## 2015-12-18 NOTE — Patient Instructions (Addendum)
  Test(s) ordered today. Your results will be released to [place] (or called to you) after review, usually within 72hours after test completion. If any changes need to be made, you will be notified at that same time.  All other Health Maintenance issues reviewed.   All recommended immunizations and age-appropriate screenings are up-to-date or discussed.  Flu vaccine administered today.   Medications reviewed and updated.  No changes recommended at this time.  Your prescription(s) have been submitted to your pharmacy. Please take as directed and contact our office if you believe you are having problem(s) with the medication(s).   Please followup in 6 weeks

## 2015-12-18 NOTE — Progress Notes (Signed)
Subjective:    Patient ID: Rhonda Reynolds, female    DOB: Apr 12, 1934, 81 y.o.   MRN: [id number]  HPI She is here to establish with a new pcp.    Weight loss/inability to gain weight, decreased appetite:  She has lost weight and wonders why she can not gain weight.  It sounds like her weight has been stable over the past several years - she is just not able to gain weight.  Her appetite is good, sometimes.  When it is hot she does not have an appetite.  She has seen a nutritionist years ago and was on boost, but it affected her teeth.  She eats a chicken, [place], occasionally pork.  She eats a lof vegetables and sandwiches.   In 1995 she weighed 125 and that is her baseline. She had a thyroid problem and had RAI for an overactive thyroid.  She lost a lot of weight during that time.  She has been 89-99 over the past 10 years.    Hypertension: She is taking her medication daily. She is compliant with a low sodium diet.  She denies chest pain, palpitations, edema, shortness of breath and regular headaches. She is exercising sometimes.  She does not monitor her blood pressure at home.    She smokes and wants to quit.  She has planned on quitting  - today is the last day she plans on smoking.  She plans on using nicotine patches and already has them at home.   ? Diabetes:  She has a history of diabetes.  She was told by one doctor she had diabetes and a second doctor told her she did not.  She was on medication for a brief period of time.   CKD:  She has never been told she had kidney problems.    OP:  She is on fosamax, but often does not take it.    Medications and allergies reviewed with patient and updated if appropriate.  Patient Active Problem List   Diagnosis Date Noted  . Essential hypertension 12/18/2015  . PAD (peripheral artery disease) ([place]) 12/18/2015  . Osteoporosis 12/18/2015  . History of left breast cancer 12/18/2015  . Diabetes ([place]) 12/18/2015  . CKD (chronic kidney  disease) stage 3, GFR 30-59 ml/min 12/18/2015  . Neoplasm of left breast, primary tumor staging category Tis: ductal carcinoma in situ (DCIS) 03/25/2013    Current Outpatient Prescriptions on File Prior to Visit  Medication Sig Dispense Refill  . carboxymethylcellulose (REFRESH PLUS) 0.5 % SOLN 1 drop 3 (three) times daily as needed.    . carboxymethylcellulose 1 % ophthalmic solution 1 drop at bedtime.    . cholecalciferol (VITAMIN D) 1000 UNITS tablet Take 3,000 Units by mouth daily.    . hyaluronate sodium (RADIAPLEXRX) GEL Apply 1 application topically 2 (two) times daily.    Marland Kitchen lisinopril-hydrochlorothiazide (PRINZIDE,ZESTORETIC) 20-12.5 MG per tablet 1 tablet daily.     . non-metallic deodorant Jethro Poling) MISC Apply 1 application topically daily as needed.     No current facility-administered medications on file prior to visit.     Past Medical History:  Diagnosis Date  . Arthritis   . Breast cancer ([place])    left  . Diabetes mellitus without complication ([place])   . GERD (gastroesophageal reflux disease)    occ  . History of radiation therapy 04/15/13-05/12/13   42.5 gray to left breast. lumpectomy cavity to 50 gray  . Hyperlipidemia   . Hypertension   . Hyperthyroidism   .  Osteoporosis     Past Surgical History:  Procedure Laterality Date  . ABDOMINAL HYSTERECTOMY    . BREAST LUMPECTOMY WITH NEEDLE LOCALIZATION Left 03/04/2013   Procedure: LEFT BREAST LUMPECTOMY WITH NEEDLE LOCALIZATION;  Surgeon: Imogene Burn. Georgette Dover, MD;  Location: [facility];  Service: General;  Laterality: Left;  . BREAST SURGERY    . EYE SURGERY Bilateral 16  . SPINE SURGERY  30    Social History   Social History  . Marital status: Single    Spouse name: N/A  . Number of children: 4  . Years of education: N/A   Social History Main Topics  . Smoking status: Current Every Day Smoker    Packs/day: 0.50    Years: 40.00    Types: Cigarettes  . Smokeless tobacco: Never Used  . Alcohol use No  . Drug use:  No  . Sexual activity: Not Asked   Other Topics Concern  . None   Social History Narrative   Widowed   Retired Control and instrumentation engineer       Family History  Problem Relation Age of Onset  . Cancer Mother     pt canot remember  . Diabetes Mother   . Stroke Mother   . Heart failure Father   . Lung cancer Brother     Review of Systems  Constitutional: Positive for appetite change (decreased), fatigue (low energy level) and unexpected weight change (loss). Negative for chills and fever.  HENT: Positive for hearing loss.   Eyes: Negative for visual disturbance.  Respiratory: Positive for cough (first thing in morning only). Negative for shortness of breath and wheezing.   Cardiovascular: Negative for chest pain, palpitations and leg swelling.  Gastrointestinal: Positive for constipation (if she does not eat right). Negative for abdominal pain (occasional epigastric pain with eating too much acid or pushing in the area), blood in stool, diarrhea and nausea.  Genitourinary: Negative for dysuria and hematuria.  Musculoskeletal: Positive for back pain (sometimes, chronic). Negative for arthralgias.  Neurological: Positive for light-headedness (sometimes with quick movements). Negative for dizziness and headaches.  Psychiatric/Behavioral: Negative for dysphoric mood. The patient is not nervous/anxious.        Objective:   Vitals:   12/19/15 0904  BP: 114/84  Pulse: 74  Resp: 16  Temp: 97.6 F (36.4 C)   Filed Weights   12/19/15 0904  Weight: 82 lb 6 oz (37.4 kg)   Body mass index is 12.9 kg/m.   Physical Exam Constitutional: She appears cachectic. No distress.  HENT:  Head: Normocephalic and atraumatic.  Right Ear: External ear normal. Normal ear canal and TM Left Ear: External ear normal.  Normal ear canal and TM Mouth/Throat: Oropharynx is clear and moist.  Poor dentition Eyes: Conjunctivae normal.  Neck: Neck supple. No tracheal deviation present. No thyromegaly  present.  No carotid bruit  Cardiovascular: Normal rate, regular rhythm and normal heart sounds.   No murmur heard.  No edema. Pulmonary/Chest: Effort normal and breath sounds normal. No respiratory distress. She has no wheezes. She has no rales.  Abdominal: Soft. She exhibits no distension. There is tenderness in her epigastric region without rebound or guarding.  She denies tenderness elsewhere.  Lymphadenopathy: She has no cervical adenopathy. No supraclavicular adenopathy. Skin: Skin is warm and dry. She is not diaphoretic.  Psychiatric: She has a normal mood and affect. Her behavior is normal.         Assessment & Plan:    Flu vaccine  See Problem List  for Assessment and Plan of chronic medical problems.   F/u in 3 months

## 2015-12-19 ENCOUNTER — Encounter: Payer: Self-pay | Admitting: Internal Medicine

## 2015-12-19 ENCOUNTER — Other Ambulatory Visit (INDEPENDENT_AMBULATORY_CARE_PROVIDER_SITE_OTHER): Payer: Medicare Other

## 2015-12-19 ENCOUNTER — Ambulatory Visit (INDEPENDENT_AMBULATORY_CARE_PROVIDER_SITE_OTHER): Payer: Medicare Other | Admitting: Internal Medicine

## 2015-12-19 DIAGNOSIS — N183 Chronic kidney disease, stage 3 unspecified: Secondary | ICD-10-CM

## 2015-12-19 DIAGNOSIS — M810 Age-related osteoporosis without current pathological fracture: Secondary | ICD-10-CM | POA: Diagnosis not present

## 2015-12-19 DIAGNOSIS — R634 Abnormal weight loss: Secondary | ICD-10-CM | POA: Insufficient documentation

## 2015-12-19 DIAGNOSIS — E119 Type 2 diabetes mellitus without complications: Secondary | ICD-10-CM

## 2015-12-19 DIAGNOSIS — Z853 Personal history of malignant neoplasm of breast: Secondary | ICD-10-CM

## 2015-12-19 DIAGNOSIS — I10 Essential (primary) hypertension: Secondary | ICD-10-CM

## 2015-12-19 DIAGNOSIS — I739 Peripheral vascular disease, unspecified: Secondary | ICD-10-CM

## 2015-12-19 DIAGNOSIS — Z8639 Personal history of other endocrine, nutritional and metabolic disease: Secondary | ICD-10-CM

## 2015-12-19 DIAGNOSIS — F17200 Nicotine dependence, unspecified, uncomplicated: Secondary | ICD-10-CM | POA: Insufficient documentation

## 2015-12-19 DIAGNOSIS — E46 Unspecified protein-calorie malnutrition: Secondary | ICD-10-CM

## 2015-12-19 DIAGNOSIS — Z23 Encounter for immunization: Secondary | ICD-10-CM | POA: Diagnosis not present

## 2015-12-19 DIAGNOSIS — E1122 Type 2 diabetes mellitus with diabetic chronic kidney disease: Secondary | ICD-10-CM

## 2015-12-19 DIAGNOSIS — H903 Sensorineural hearing loss, bilateral: Secondary | ICD-10-CM

## 2015-12-19 LAB — LIPID PANEL
CHOL/HDL RATIO: 3
CHOLESTEROL: 166 mg/dL (ref 0–200)
HDL: 58.9 mg/dL (ref >39.00)
LDL CALC: 94 mg/dL (ref 0–99)
NonHDL: 107.56
TRIGLYCERIDES: 67 mg/dL (ref 0.0–149.0)
VLDL: 13.4 mg/dL (ref 0.0–40.0)

## 2015-12-19 LAB — CBC WITH DIFFERENTIAL/PLATELET
BASOS ABS: 0 10*3/uL (ref 0.0–0.1)
BASOS PCT: 0.5 % (ref 0.0–3.0)
EOS ABS: 0.1 10*3/uL (ref 0.0–0.7)
Eosinophils Relative: 1.5 % (ref 0.0–5.0)
HCT: 43.2 % (ref 36.0–46.0)
Hemoglobin: 14.7 g/dL (ref 12.0–15.0)
LYMPHS ABS: 1.7 10*3/uL (ref 0.7–4.0)
Lymphocytes Relative: 26.9 % (ref 12.0–46.0)
MCHC: 34 g/dL (ref 30.0–36.0)
MCV: 90.2 fl (ref 78.0–100.0)
Monocytes Absolute: 0.6 10*3/uL (ref 0.1–1.0)
Monocytes Relative: 9.9 % (ref 3.0–12.0)
NEUTROS PCT: 61.2 % (ref 43.0–77.0)
Neutro Abs: 3.9 10*3/uL (ref 1.4–7.7)
Platelets: 192 10*3/uL (ref 150.0–400.0)
RBC: 4.79 Mil/uL (ref 3.87–5.11)
RDW: 13.7 % (ref 11.5–15.5)
WBC: 6.4 10*3/uL (ref 4.0–10.5)

## 2015-12-19 LAB — HEMOGLOBIN A1C: Hgb A1c MFr Bld: 6.4 % (ref 4.6–6.5)

## 2015-12-19 LAB — COMPREHENSIVE METABOLIC PANEL
ALT: 7 U/L (ref 0–35)
AST: 17 U/L (ref 0–37)
Albumin: 4.5 g/dL (ref 3.5–5.2)
Alkaline Phosphatase: 66 U/L (ref 39–117)
BUN: 12 mg/dL (ref 6–23)
CHLORIDE: 100 meq/L (ref 96–112)
CO2: 34 meq/L — AB (ref 19–32)
CREATININE: 0.7 mg/dL (ref 0.40–1.20)
Calcium: 9.9 mg/dL (ref 8.4–10.5)
GFR: 103.8 mL/min (ref >60.00)
GLUCOSE: 94 mg/dL (ref 70–99)
Potassium: 5.1 mEq/L (ref 3.5–5.1)
SODIUM: 140 meq/L (ref 135–145)
Total Bilirubin: 0.7 mg/dL (ref 0.2–1.2)
Total Protein: 8.1 g/dL (ref 6.0–8.3)

## 2015-12-19 LAB — T4, FREE: FREE T4: 1.09 ng/dL (ref 0.60–1.60)

## 2015-12-19 LAB — MICROALBUMIN / CREATININE URINE RATIO
CREATININE, U: 140.7 mg/dL
MICROALB UR: 4.9 mg/dL — AB (ref 0.0–1.9)
Microalb Creat Ratio: 3.5 mg/g (ref 0.0–30.0)

## 2015-12-19 LAB — TSH: TSH: 0.65 u[IU]/mL (ref 0.35–4.50)

## 2015-12-19 LAB — VITAMIN D 25 HYDROXY (VIT D DEFICIENCY, FRACTURES): VITD: 37.6 ng/mL (ref 30.00–100.00)

## 2015-12-19 MED ORDER — ALENDRONATE SODIUM 70 MG PO TABS: 70.0000 mg | ORAL_TABLET | ORAL | 11 refills | Status: DC

## 2015-12-19 NOTE — Assessment & Plan Note (Signed)
No pain with walking

## 2015-12-19 NOTE — Assessment & Plan Note (Signed)
Denies history of hypothyroidism Check tsh, Ft4

## 2015-12-19 NOTE — Assessment & Plan Note (Signed)
Follows up annually No evidence of recurrence

## 2015-12-19 NOTE — Assessment & Plan Note (Signed)
Check cmp ? Kidney disease - she denies it

## 2015-12-19 NOTE — Assessment & Plan Note (Signed)
Not compliant with fosamax

## 2015-12-19 NOTE — Assessment & Plan Note (Signed)
?   Has diabetes Check a1c

## 2015-12-19 NOTE — Assessment & Plan Note (Signed)
Weight has been stable - unable to gain weight Deferred nutrition referral Limited by money, poor dentition Stressed smoking cessation - may improve her appetite Monitor weight Check labs Deferred boost,ensure - affected teeth last time she drank them

## 2015-12-19 NOTE — Assessment & Plan Note (Signed)
BP Readings from Last 3 Encounters:  12/19/15 114/84  09/11/13 124/70  06/19/13 (!) 144/88   BP well controlled Current regimen effective and well tolerated Continue current medications at current doses cmp

## 2015-12-19 NOTE — Assessment & Plan Note (Signed)
encouraged increased intake Deferred supplemental shakes deferred nutrition Monitor weight

## 2015-12-19 NOTE — Assessment & Plan Note (Signed)
Wears hearing aids 

## 2015-12-19 NOTE — Progress Notes (Signed)
Pre visit review using our clinic review tool, if applicable. No additional management support is needed unless otherwise documented below in the visit note. 

## 2015-12-19 NOTE — Assessment & Plan Note (Signed)
Stressed smoking cessation Quitting may improve appetite

## 2015-12-21 ENCOUNTER — Encounter: Payer: Self-pay | Admitting: Internal Medicine

## 2015-12-21 DIAGNOSIS — R7303 Prediabetes: Secondary | ICD-10-CM | POA: Insufficient documentation

## 2016-01-05 DIAGNOSIS — H04123 Dry eye syndrome of bilateral lacrimal glands: Secondary | ICD-10-CM | POA: Diagnosis not present

## 2016-01-05 DIAGNOSIS — H40023 Open angle with borderline findings, high risk, bilateral: Secondary | ICD-10-CM | POA: Diagnosis not present

## 2016-01-05 DIAGNOSIS — M3501 Sicca syndrome with keratoconjunctivitis: Secondary | ICD-10-CM | POA: Diagnosis not present

## 2016-01-28 NOTE — Progress Notes (Signed)
Subjective:    Patient ID: Rhonda Reynolds, female    DOB: 1934/11/29, 81 y.o.   MRN: [id number]  HPI She is here for follow up.  Smoking:  States she was going to quit 6 weeks ago.  She did quit for one week and started again.  She is now smoking 3-4 cigarettes a day.  She is looking into nicoderm.   Protein-calories malnutrition:  She is frustrated that she is unable to gain weight. Her weight has been stable for a while.  She see was here last she states she has been eating all day.  She has gained 4 lbs since she was here. She wants to continue to gain weight.   Prediabetes:  She eats a lot of pasta and potatoes.  She is walking a little for exercise - just less than one mile.  She has been a prediabetic for years.     Medications and allergies reviewed with patient and updated if appropriate.  Patient Active Problem List   Diagnosis Date Noted  . Prediabetes 12/21/2015  . Protein-calorie malnutrition ([place]) 12/19/2015  . Sensorineural hearing loss of both ears 12/19/2015  . Loss of weight 12/19/2015  . Tobacco use disorder 12/19/2015  . History of hyperthyroidism 12/19/2015  . Essential hypertension 12/18/2015  . PAD (peripheral artery disease) ([place]) 12/18/2015  . Osteoporosis 12/18/2015  . History of left breast cancer 12/18/2015  . Neoplasm of left breast, primary tumor staging category Tis: ductal carcinoma in situ (DCIS) 03/25/2013    Current Outpatient Prescriptions on File Prior to Visit  Medication Sig Dispense Refill  . alendronate (FOSAMAX) 70 MG tablet Take 1 tablet (70 mg total) by mouth every 7 (seven) days. Take with a full glass of water on an empty stomach. 4 tablet 11  . aspirin EC 81 MG tablet Take 81 mg by mouth daily.    . carboxymethylcellulose (REFRESH PLUS) 0.5 % SOLN 1 drop 3 (three) times daily as needed.    . cholecalciferol (VITAMIN D) 1000 UNITS tablet Take 3,000 Units by mouth daily.    . hyaluronate sodium (RADIAPLEXRX) GEL Apply 1 application  topically 2 (two) times daily.    Marland Kitchen lisinopril-hydrochlorothiazide (PRINZIDE,ZESTORETIC) 20-12.5 MG per tablet 1 tablet daily.     . non-metallic deodorant Jethro Poling) MISC Apply 1 application topically daily as needed.     No current facility-administered medications on file prior to visit.     Past Medical History:  Diagnosis Date  . Arthritis   . Breast cancer ([place])    left  . Diabetes mellitus without complication ([place])   . GERD (gastroesophageal reflux disease)    occ  . History of radiation therapy 04/15/13-05/12/13   42.5 gray to left breast. lumpectomy cavity to 50 gray  . Hyperlipidemia   . Hypertension   . Hyperthyroidism   . Osteoporosis     Past Surgical History:  Procedure Laterality Date  . ABDOMINAL HYSTERECTOMY    . BREAST LUMPECTOMY WITH NEEDLE LOCALIZATION Left 03/04/2013   Procedure: LEFT BREAST LUMPECTOMY WITH NEEDLE LOCALIZATION;  Surgeon: Imogene Burn. Georgette Dover, MD;  Location: [facility];  Service: General;  Laterality: Left;  . BREAST SURGERY    . EYE SURGERY Bilateral 14  . SPINE SURGERY  65    Social History   Social History  . Marital status: Single    Spouse name: N/A  . Number of children: 4  . Years of education: N/A   Social History Main Topics  . Smoking status:  Current Every Day Smoker    Packs/day: 0.50    Years: 40.00    Types: Cigarettes  . Smokeless tobacco: Never Used  . Alcohol use No  . Drug use: No  . Sexual activity: Not on file   Other Topics Concern  . Not on file   Social History Narrative   Widowed   Retired Control and instrumentation engineer       Family History  Problem Relation Age of Onset  . Cancer Mother     pt canot remember  . Diabetes Mother   . Stroke Mother   . Heart failure Father   . Lung cancer Brother     Review of Systems  Constitutional: Negative for fever.  HENT: Negative for sore throat and trouble swallowing.   Respiratory: Positive for cough (in morning only). Negative for shortness of breath and wheezing.     Cardiovascular: Negative for chest pain, palpitations and leg swelling.  Neurological: Negative for dizziness, light-headedness and headaches.       Objective:   Vitals:   01/30/16 0931  BP: 102/64  Pulse: 72  Resp: 16  Temp: 97.7 F (36.5 C)   Filed Weights   01/30/16 0931  Weight: 86 lb (39 kg)   Body mass index is 13.47 kg/m.   Physical Exam Constitutional: thin, frail female. No distress.  HENT:  Head: Normocephalic and atraumatic.  Neck: Oropharynx without erythema, Neck supple. No tracheal deviation present. No thyromegaly present.  No cervical lymphadenopathy Cardiovascular: Normal rate, regular rhythm and normal heart sounds.   No murmur heard. No carotid bruit .  No edema Pulmonary/Chest: Effort normal and breath sounds normal. No respiratory distress. No has no wheezes. No rales.  Skin: Skin is warm and dry. Not diaphoretic.  Psychiatric: Normal mood and affect. Behavior is normal.        Assessment & Plan:   See Problem List for Assessment and Plan of chronic medical problems.  F/u in 6 months

## 2016-01-30 ENCOUNTER — Ambulatory Visit (INDEPENDENT_AMBULATORY_CARE_PROVIDER_SITE_OTHER): Payer: Medicare Other | Admitting: Internal Medicine

## 2016-01-30 ENCOUNTER — Encounter: Payer: Self-pay | Admitting: Internal Medicine

## 2016-01-30 VITALS — BP 102/64 | HR 72 | Temp 97.7°F | Resp 16 | Ht 67.0 in | Wt 86.0 lb

## 2016-01-30 DIAGNOSIS — E46 Unspecified protein-calorie malnutrition: Secondary | ICD-10-CM | POA: Diagnosis not present

## 2016-01-30 DIAGNOSIS — I10 Essential (primary) hypertension: Secondary | ICD-10-CM | POA: Diagnosis not present

## 2016-01-30 DIAGNOSIS — R7303 Prediabetes: Secondary | ICD-10-CM

## 2016-01-30 MED ORDER — CYCLOSPORINE 0.05 % OP EMUL: 1.0000 [drp] | Freq: Two times a day (BID) | OPHTHALMIC | 5 refills | Status: DC

## 2016-01-30 NOTE — Assessment & Plan Note (Signed)
BP on the low side today, but typically a little higher No lightheadedness BP well controlled Current regimen effective and well tolerated Continue current medications at current doses

## 2016-01-30 NOTE — Progress Notes (Signed)
Pre visit review using our clinic review tool, if applicable. No additional management support is needed unless otherwise documented below in the visit note. 

## 2016-01-30 NOTE — Patient Instructions (Addendum)
  We reviewed your blood work.   Medications reviewed and updated.  No changes recommended at this time.    Please followup in 6 months   

## 2016-01-30 NOTE — Assessment & Plan Note (Signed)
Lab Results  Component Value Date   HGBA1C 6.4 12/19/2015    Stable - has been in the prediabetic range for years Monitor every 6 months

## 2016-01-30 NOTE — Assessment & Plan Note (Signed)
Has gained 4 lbs - will continue to eat more and work on weight gain Deferred any boost/ensure - damaged her teeth in the past

## 2016-02-06 ENCOUNTER — Other Ambulatory Visit: Payer: Self-pay | Admitting: *Deleted

## 2016-02-06 MED ORDER — LISINOPRIL-HYDROCHLOROTHIAZIDE 20-12.5 MG PO TABS: 1.0000 | ORAL_TABLET | Freq: Every day | ORAL | 5 refills | Status: DC

## 2016-04-03 ENCOUNTER — Telehealth: Payer: Self-pay | Admitting: Internal Medicine

## 2016-04-03 DIAGNOSIS — Z853 Personal history of malignant neoplasm of breast: Secondary | ICD-10-CM

## 2016-04-03 DIAGNOSIS — Z1231 Encounter for screening mammogram for malignant neoplasm of breast: Secondary | ICD-10-CM

## 2016-04-03 DIAGNOSIS — D0512 Intraductal carcinoma in situ of left breast: Secondary | ICD-10-CM

## 2016-04-03 NOTE — Telephone Encounter (Signed)
Patient dropped by the office because she is trying to schedule her annual mammogram, but due to her history of breast cancer. Patient was wondering if that order could be placed and the appt be scheduled. Please advise

## 2016-04-04 NOTE — Telephone Encounter (Signed)
mammo ordered.

## 2016-04-04 NOTE — Telephone Encounter (Signed)
Please advise 

## 2016-04-05 NOTE — Telephone Encounter (Signed)
Spoke with pt to inform order was placed.  

## 2016-04-25 ENCOUNTER — Telehealth: Payer: Self-pay | Admitting: Internal Medicine

## 2016-04-25 NOTE — Telephone Encounter (Signed)
Called patient to schedule awv. Left msg for patient to call office to schedule appt.  °

## 2016-05-18 ENCOUNTER — Telehealth: Payer: Self-pay | Admitting: Internal Medicine

## 2016-05-18 DIAGNOSIS — Z853 Personal history of malignant neoplasm of breast: Secondary | ICD-10-CM

## 2016-05-18 NOTE — Telephone Encounter (Signed)
Patient called in requesting diagnostic mammogram to be sent to [facility].  Patient has had breast cancer in the past and would like this done.  Patient is requesting call once referral has been placed.

## 2016-05-21 NOTE — Telephone Encounter (Signed)
ordered

## 2016-05-29 ENCOUNTER — Ambulatory Visit
Admission: RE | Admit: 2016-05-29 | Discharge: 2016-05-29 | Disposition: A | Discharge status: Home / Self Care | Payer: Medicare Other | Source: Ambulatory Visit | Attending: Internal Medicine | Admitting: Internal Medicine

## 2016-05-29 DIAGNOSIS — Z853 Personal history of malignant neoplasm of breast: Secondary | ICD-10-CM

## 2016-05-29 DIAGNOSIS — R922 Inconclusive mammogram: Secondary | ICD-10-CM | POA: Diagnosis not present

## 2016-05-29 HISTORY — DX: Personal history of irradiation: Z92.3

## 2016-06-08 DIAGNOSIS — D0512 Intraductal carcinoma in situ of left breast: Secondary | ICD-10-CM | POA: Diagnosis not present

## 2016-06-24 NOTE — Progress Notes (Signed)
Subjective:    Patient ID: Rhonda Reynolds, female    DOB: [birth year], 82 y.o.   MRN: [id number]  HPI She is here for an acute visit.   Chest pain:  She was in church about one month ago and had a severe pain in the center of her chest. The pain lasted a few seconds, but she remained sore for a while.  She felt tired for a while after that last days.  She has not had any recurrent episodes of chest pain. She denies chest discomfort with her ADL's.  The pain occurred a day or two after having her mammogram and she wondered if that was the cause - she was not sore after the mammogram or prior to having the sharp pain.   In general, she feels generalized weakness and tires easy.  She does have some SOB, but relates it to smoking.  She is cutting down and plans on quitting.    Hypertension:  She takes her medication daily, except for Sundays - she does not want to be urinating all day when she goes to church.  She has not taken it yet this morning.   Prediabetes:  She is compliant with a low sugar/carbohydrate diet.  She is not exercising regularly.   Medications and allergies reviewed with patient and updated if appropriate.  Patient Active Problem List   Diagnosis Date Noted  . Prediabetes 12/21/2015  . Protein-calorie malnutrition ([place]) 12/19/2015  . Sensorineural hearing loss of both ears 12/19/2015  . Loss of weight 12/19/2015  . Tobacco use disorder 12/19/2015  . History of hyperthyroidism 12/19/2015  . Essential hypertension 12/18/2015  . PAD (peripheral artery disease) ([place]) 12/18/2015  . Osteoporosis 12/18/2015  . History of left breast cancer 12/18/2015  . Neoplasm of left breast, primary tumor staging category Tis: ductal carcinoma in situ (DCIS) 03/25/2013    Current Outpatient Prescriptions on File Prior to Visit  Medication Sig Dispense Refill  . alendronate (FOSAMAX) 70 MG tablet Take 1 tablet (70 mg total) by mouth every 7 (seven) days. Take with a full glass of  water on an empty stomach. 4 tablet 11  . aspirin EC 81 MG tablet Take 81 mg by mouth daily.    . carboxymethylcellulose (REFRESH PLUS) 0.5 % SOLN 1 drop 3 (three) times daily as needed.    . cholecalciferol (VITAMIN D) 1000 UNITS tablet Take 3,000 Units by mouth daily.    . cycloSPORINE (RESTASIS) 0.05 % ophthalmic emulsion Place 1 drop into both eyes 2 (two) times daily. 0.4 mL 5  . fluorometholone (FML) 0.1 % ophthalmic suspension   0  . hyaluronate sodium (RADIAPLEXRX) GEL Apply 1 application topically 2 (two) times daily.    Marland Kitchen lisinopril-hydrochlorothiazide (PRINZIDE,ZESTORETIC) 20-12.5 MG tablet Take 1 tablet by mouth daily. 30 tablet 5   No current facility-administered medications on file prior to visit.     Past Medical History:  Diagnosis Date  . Arthritis   . Breast cancer ([place])    left  . Diabetes mellitus without complication ([place])   . GERD (gastroesophageal reflux disease)    occ  . History of radiation therapy 04/15/13-05/12/13   42.5 gray to left breast. lumpectomy cavity to 50 gray  . Hyperlipidemia   . Hypertension   . Hyperthyroidism   . Osteoporosis   . Personal history of radiation therapy 2015    Past Surgical History:  Procedure Laterality Date  . ABDOMINAL HYSTERECTOMY    . BREAST LUMPECTOMY Left 03/04/2013  .  BREAST LUMPECTOMY WITH NEEDLE LOCALIZATION Left 03/04/2013   Procedure: LEFT BREAST LUMPECTOMY WITH NEEDLE LOCALIZATION;  Surgeon: Imogene Burn. Georgette Dover, MD;  Location: [facility];  Service: General;  Laterality: Left;  . BREAST SURGERY    . EYE SURGERY Bilateral 16  . SPINE SURGERY  68    Social History   Social History  . Marital status: Single    Spouse name: N/A  . Number of children: 4  . Years of education: N/A   Social History Main Topics  . Smoking status: Current Every Day Smoker    Packs/day: 0.50    Years: 40.00    Types: Cigarettes  . Smokeless tobacco: Never Used  . Alcohol use No  . Drug use: No  . Sexual activity: Not Asked    Other Topics Concern  . None   Social History Narrative   Widowed   Retired Control and instrumentation engineer       Family History  Problem Relation Age of Onset  . Cancer Mother     pt canot remember  . Diabetes Mother   . Stroke Mother   . Heart failure Father   . Lung cancer Brother     Review of Systems  Constitutional: Negative for appetite change and fever.  Respiratory: Positive for cough and shortness of breath (related to smoking). Negative for wheezing.   Cardiovascular: Positive for chest pain (one episode). Negative for palpitations and leg swelling.  Gastrointestinal: Negative for abdominal pain (no pain, but tender), constipation, diarrhea and nausea.       No gerd  Neurological: Negative for light-headedness and headaches.       Objective:   Vitals:   06/25/16 0805  BP: (!) 164/86  Pulse: 78  Resp: 16  Temp: 97.8 F (36.6 C)   Filed Weights   06/25/16 0805  Weight: 85 lb (38.6 kg)   Body mass index is 13.31 kg/m.  Wt Readings from Last 3 Encounters:  06/25/16 85 lb (38.6 kg)  01/30/16 86 lb (39 kg)  12/19/15 82 lb 6 oz (37.4 kg)     Physical Exam Constitutional: Appears well-developed and well-nourished. No distress.  HENT:  Head: Normocephalic and atraumatic.  Neck: Neck supple. No tracheal deviation present. No thyromegaly present.  No cervical lymphadenopathy Cardiovascular: Normal rate, regular rhythm with premature beats, normal heart sounds.   No murmur heard. No carotid bruit .  No edema Pulmonary/Chest: Effort normal and breath sounds normal. No respiratory distress. No has no wheezes. No rales.  Abdomen: soft, tenderness in epigastric region to umbilical region that is chronic and unchanged (evaluated in the past), non distended, no masses Skin: Skin is warm and dry. Not diaphoretic.  Psychiatric: Normal mood and affect. Behavior is normal.     Diagnostic Mammogram 05/29/16: FINDINGS: No suspicious masses or calcifications are seen in  either breast. Postsurgical changes are present in the upper-outer posterior left breast related to prior lumpectomy. Spot compression magnification CC view of the lumpectomy site in the left breast was performed. There is no mammographic evidence of locally recurrent malignancy.  Mammographic images were processed with CAD.  IMPRESSION: No mammographic evidence of malignancy in either breast.  RECOMMENDATION: Diagnostic mammogram is suggested in 1 year. (Code:DM-B-01Y)  I have discussed the findings and recommendations with the patient. Results were also provided in writing at the conclusion of the visit. If applicable, a reminder letter will be sent to the patient regarding the next appointment.  BI-RADS CATEGORY  2: Benign.  Assessment & Plan:   See Problem List for Assessment and Plan of chronic medical problems.

## 2016-06-25 ENCOUNTER — Ambulatory Visit (INDEPENDENT_AMBULATORY_CARE_PROVIDER_SITE_OTHER): Payer: Medicare Other | Admitting: Internal Medicine

## 2016-06-25 ENCOUNTER — Other Ambulatory Visit (INDEPENDENT_AMBULATORY_CARE_PROVIDER_SITE_OTHER): Payer: Medicare Other

## 2016-06-25 ENCOUNTER — Encounter: Payer: Self-pay | Admitting: Emergency Medicine

## 2016-06-25 VITALS — BP 164/86 | HR 78 | Temp 97.8°F | Resp 16 | Wt 85.0 lb

## 2016-06-25 DIAGNOSIS — R079 Chest pain, unspecified: Secondary | ICD-10-CM

## 2016-06-25 DIAGNOSIS — I10 Essential (primary) hypertension: Secondary | ICD-10-CM

## 2016-06-25 DIAGNOSIS — R7303 Prediabetes: Secondary | ICD-10-CM

## 2016-06-25 LAB — COMPREHENSIVE METABOLIC PANEL
ALBUMIN: 4.6 g/dL (ref 3.5–5.2)
ALT: 8 U/L (ref 0–35)
AST: 17 U/L (ref 0–37)
Alkaline Phosphatase: 66 U/L (ref 39–117)
BILIRUBIN TOTAL: 0.6 mg/dL (ref 0.2–1.2)
BUN: 11 mg/dL (ref 6–23)
CO2: 33 meq/L — AB (ref 19–32)
CREATININE: 0.69 mg/dL (ref 0.40–1.20)
Calcium: 10 mg/dL (ref 8.4–10.5)
Chloride: 101 mEq/L (ref 96–112)
GFR: 105.4 mL/min (ref >60.00)
Glucose, Bld: 99 mg/dL (ref 70–99)
Potassium: 4.1 mEq/L (ref 3.5–5.1)
Sodium: 141 mEq/L (ref 135–145)
TOTAL PROTEIN: 7.9 g/dL (ref 6.0–8.3)

## 2016-06-25 LAB — LIPID PANEL
Cholesterol: 174 mg/dL (ref 0–200)
HDL: 67.4 mg/dL (ref >39.00)
LDL Cholesterol: 96 mg/dL (ref 0–99)
NonHDL: 107.02
Total CHOL/HDL Ratio: 3
Triglycerides: 56 mg/dL (ref 0.0–149.0)
VLDL: 11.2 mg/dL (ref 0.0–40.0)

## 2016-06-25 LAB — HEMOGLOBIN A1C: HEMOGLOBIN A1C: 6.6 % — AB (ref 4.6–6.5)

## 2016-06-25 NOTE — Assessment & Plan Note (Addendum)
One episode about one month ago High risk with age, smoking, prediabetic and htn ekg today - sinus rhythm at 68 bpm, PAC's, RSR, negative T waves V1-2 - T wave inversion new in V2 compared to 2014 Will refer to cardiology for evaluation/ stress test Stressed smoking cessation Check labs today

## 2016-06-25 NOTE — Progress Notes (Signed)
Pre visit review using our clinic review tool, if applicable. No additional management support is needed unless otherwise documented below in the visit note. 

## 2016-06-25 NOTE — Patient Instructions (Signed)
  Test(s) ordered today. Your results will be released to [facility] (or called to you) after review, usually within 72hours after test completion. If any changes need to be made, you will be notified at that same time.  An EKG was done today.   Medications reviewed and updated.  No changes recommended at this time.  Make sure you are taking your blood pressure medication every day.    A referral was ordered for cardiology for a possible stress test.   Please followup in 6 months for a physical

## 2016-06-25 NOTE — Assessment & Plan Note (Signed)
Typically controlled - but has not taken her medication in two days - does not take it on Sundays due to increased urination Discussed separating the lisinopril and hctz so she would only skip the hctz on Sundays - but she declined Stressed taking the medication everyday - discussed risk of stroke Continue current medication cmp, lipid

## 2016-06-25 NOTE — Assessment & Plan Note (Signed)
Check a1c Low sugar / carb diet Stressed regular exercise   

## 2016-06-26 ENCOUNTER — Encounter: Payer: Self-pay | Admitting: Internal Medicine

## 2016-06-26 DIAGNOSIS — E119 Type 2 diabetes mellitus without complications: Secondary | ICD-10-CM | POA: Insufficient documentation

## 2016-07-24 ENCOUNTER — Encounter (INDEPENDENT_AMBULATORY_CARE_PROVIDER_SITE_OTHER): Payer: Self-pay

## 2016-07-24 ENCOUNTER — Encounter: Payer: Self-pay | Admitting: Cardiovascular Disease

## 2016-07-24 ENCOUNTER — Ambulatory Visit (INDEPENDENT_AMBULATORY_CARE_PROVIDER_SITE_OTHER): Payer: Medicare Other | Admitting: Cardiovascular Disease

## 2016-07-24 VITALS — BP 84/60 | HR 96 | Ht 67.0 in | Wt 83.0 lb

## 2016-07-24 DIAGNOSIS — R0989 Other specified symptoms and signs involving the circulatory and respiratory systems: Secondary | ICD-10-CM | POA: Diagnosis not present

## 2016-07-24 DIAGNOSIS — R079 Chest pain, unspecified: Secondary | ICD-10-CM

## 2016-07-24 DIAGNOSIS — R634 Abnormal weight loss: Secondary | ICD-10-CM | POA: Diagnosis not present

## 2016-07-24 NOTE — Patient Instructions (Signed)
Medication Instructions:  STOP Lisinopril/HCT   Labwork: TODAY - TSH   Testing/Procedures: Your physician has requested that you have an echocardiogram. Echocardiography is a painless test that uses sound waves to create images of your heart. It provides your doctor with information about the size and shape of your heart and how well your heart's chambers and valves are working. This procedure takes approximately one hour. There are no restrictions for this procedure.  Your physician has requested that you have an abdominal aorta duplex. During this test, an ultrasound is used to evaluate the aorta. Allow 30 minutes for this exam. Do not eat after midnight the day before and avoid carbonated beverages   Follow-Up: Your physician recommends that you schedule a follow-up appointment in: 3 months with Dr. Acie Fredrickson   If you need a refill on your cardiac medications before your next appointment, please call your pharmacy.   Thank you for choosing [facility]! Christen Bame, RN [phone]

## 2016-07-24 NOTE — Progress Notes (Signed)
Cardiology Office Note   Date:  07/24/2016   ID:  Rhonda Reynolds, DOB 05-30-1934, MRN [id number]  PCP:  Binnie Rail, MD  Cardiologist:   Mertie Moores, MD   Chief Complaint  Patient presents with  . Chest Pain   Problem list 1. Essential hypertension 2. Peripheral arterial disease 3. History of hyperthyroidism 4. Diabetes mellitus  5.  Hx of left  breast cancer     History of Present Illness:  Rhonda Reynolds is a 82 y.o. female who is being seen today for the evaluation of chest pain  at the request of Burns, Stacy J, MD.  Pt reports an episode of chest pain while at church about 2 months ago. The pain lasted a few few seconds and then she reported being sore for several hours.  She did not go to the emergency room.   The pain was not pleuritic. She's not had any further episodes of chest pain.  Has been losing weight . Wt Readings from Last 3 Encounters:  07/24/16 83 lb (37.6 kg)  06/25/16 85 lb (38.6 kg)  01/30/16 86 lb (39 kg)   Still smokes - 1 ppd , now smokes  Uses nicotine patches on occasion - but the recent patches caused a rash on her chest      Past Medical History:  Diagnosis Date  . Arthritis   . Breast cancer ([place])    left  . Diabetes mellitus without complication ([place])   . GERD (gastroesophageal reflux disease)    occ  . History of radiation therapy 04/15/13-05/12/13   42.5 gray to left breast. lumpectomy cavity to 50 gray  . Hyperlipidemia   . Hypertension   . Hyperthyroidism   . Osteoporosis   . Personal history of radiation therapy 2015    Past Surgical History:  Procedure Laterality Date  . ABDOMINAL HYSTERECTOMY    . BREAST LUMPECTOMY Left 03/04/2013  . BREAST LUMPECTOMY WITH NEEDLE LOCALIZATION Left 03/04/2013   Procedure: LEFT BREAST LUMPECTOMY WITH NEEDLE LOCALIZATION;  Surgeon: Imogene Burn. Georgette Dover, MD;  Location: [facility];  Service: General;  Laterality: Left;  . BREAST SURGERY    . EYE SURGERY Bilateral 14  . SPINE SURGERY  89       Current Outpatient Prescriptions  Medication Sig Dispense Refill  . alendronate (FOSAMAX) 70 MG tablet Take 1 tablet (70 mg total) by mouth every 7 (seven) days. Take with a full glass of water on an empty stomach. 4 tablet 11  . aspirin EC 81 MG tablet Take 81 mg by mouth daily.    . carboxymethylcellulose (REFRESH PLUS) 0.5 % SOLN 1 drop 3 (three) times daily as needed.    . cholecalciferol (VITAMIN D) 1000 UNITS tablet Take 3,000 Units by mouth daily.    . cycloSPORINE (RESTASIS) 0.05 % ophthalmic emulsion Place 1 drop into both eyes 2 (two) times daily. 0.4 mL 5  . fluorometholone (FML) 0.1 % ophthalmic suspension   0  . hyaluronate sodium (RADIAPLEXRX) GEL Apply 1 application topically 2 (two) times daily.    Marland Kitchen lisinopril-hydrochlorothiazide (PRINZIDE,ZESTORETIC) 20-12.5 MG tablet Take 1 tablet by mouth daily. 30 tablet 5   No current facility-administered medications for this visit.     PAD Screen 07/24/2016  Previous PAD dx? No  Previous surgical procedure? No  Pain with walking? No  Feet/toe relief with dangling? No  Painful, non-healing ulcers? No  Extremities discolored? No      Allergies:   Forteo [teriparatide (recombinant)]  Social History:  The patient  reports that she has been smoking Cigarettes.  She has a 20.00 pack-year smoking history. She has never used smokeless tobacco. She reports that she does not drink alcohol or use drugs.   Family History:  The patient's family history includes Cancer in her mother; Diabetes in her mother; Heart failure in her father; Lung cancer in her brother; Stroke in her mother.    ROS:  Please see the history of present illness.    Review of Systems: Constitutional:  denies fever, chills, diaphoresis, appetite change and fatigue.  HEENT: denies photophobia, eye pain, redness, hearing loss, ear pain, congestion, sore throat, rhinorrhea, sneezing, neck pain, neck stiffness and tinnitus.  Respiratory: denies SOB, DOE,  cough, chest tightness, and wheezing.  Cardiovascular: admits to chest pain.  Gastrointestinal: denies nausea, vomiting, abdominal pain, diarrhea, constipation, blood in stool.  Genitourinary: denies dysuria, urgency, frequency, hematuria, flank pain and difficulty urinating.  Musculoskeletal: denies  myalgias, back pain, joint swelling, arthralgias and gait problem.   Skin: denies pallor, rash and wound.  Neurological: denies dizziness, seizures, syncope, weakness, light-headedness, numbness and headaches.   Hematological: denies adenopathy, easy bruising, personal or family bleeding history.  Psychiatric/ Behavioral: denies suicidal ideation, mood changes, confusion, nervousness, sleep disturbance and agitation.       All other systems are reviewed and negative.    PHYSICAL EXAM: VS:  BP (!) 84/60 (BP Location: Left Arm, Patient Position: Sitting, Cuff Size: Normal)   Pulse 96   Ht 5\' 7"  (1.702 m)   Wt 83 lb (37.6 kg)   SpO2 99%   BMI 13.00 kg/m  , BMI Body mass index is 13 kg/m. GEN: thin, wasted elderly female  HEENT: normal  Neck: no JVD, carotid bruits, or masses Cardiac: RRR; no murmurs, rubs, or gallops,no edema  Respiratory:  clear to auscultation bilaterally, normal work of breathing GI: thin,  pulsitile abdominal aorta  MS: no deformity or atrophy  Skin: warm and dry, no rash Neuro:  Strength and sensation are intact Psych: normal   EKG:  EKG is ordered today. The ekg ordered today demonstrates  NSR at 96.   Pulmonary artery pattern, inc. RBBB    Recent Labs: 12/19/2015: Hemoglobin 14.7; Platelets 192.0; TSH 0.65 06/25/2016: ALT 8; BUN 11; Creatinine, Ser 0.69; Potassium 4.1; Sodium 141    Lipid Panel    Component Value Date/Time   CHOL 174 06/25/2016 0850   TRIG 56.0 06/25/2016 0850   HDL 67.40 06/25/2016 0850   CHOLHDL 3 06/25/2016 0850   VLDL 11.2 06/25/2016 0850   LDLCALC 96 06/25/2016 0850      Wt Readings from Last 3 Encounters:  07/24/16 83 lb  (37.6 kg)  06/25/16 85 lb (38.6 kg)  01/30/16 86 lb (39 kg)      Other studies Reviewed: Additional studies/ records that were reviewed today include: . Review of the above records demonstrates:    ASSESSMENT AND PLAN:  1.  Chest pain :   Remote - occurred 2 months ago ,  , no recurrences.  Was atypical CP   Was not pleuritic. At this point , I dont think this needs further evaluation .   She has lots of other issues that we will address first.  2. HTN - BP is actually low here today  90/62.  Will DC the Lisinopril  HCT .   3.  Weight loss:   Has continued to lose weight.  She appears wasted .  She has very  little muscle mass.  Will check TSH  She does not remember her last colonoscopy   This may need further evaluation .   4. Pulsatile abdominal aorta: She's quite thin and unable to feel her abdominal aorta. This is not all that unusual and thin patient's but it is of some concern given her history of cigarette smoking. We will get an abdominal duplex scan to evaluate her for the possibility of abdominal aortic aneurysm.    Current medicines are reviewed at length with the patient today.  The patient does not have concerns regarding medicines.  Labs/ tests ordered today include:  No orders of the defined types were placed in this encounter.    Disposition:   FU with me in 3 months       Mertie Moores, MD  07/24/2016 2:00 PM    [address], [place], [place]  [address] Phone: [phone]; Fax: [phone]

## 2016-07-25 LAB — TSH: TSH: 1.51 u[IU]/mL (ref 0.450–4.500)

## 2016-07-30 ENCOUNTER — Telehealth: Payer: Self-pay | Admitting: Cardiovascular Disease

## 2016-07-30 NOTE — Telephone Encounter (Signed)
New Message  Pt call requesting to speak with RN about getting a prescription for a cough. Pt states she is having a lot of congestion. Please call back to discuss

## 2016-07-30 NOTE — Telephone Encounter (Signed)
Spoke with patient who c/o coughing and chest congestion and asks what she can take. I advised her to take Mucinex without decongestant. She asked if she should continue to stay off her BP medicine until her next ov and I confirmed. I advised her to follow up with PCP if symptoms do not improve. She verbalized understanding and agreement and thanked me for the call.

## 2016-08-08 ENCOUNTER — Other Ambulatory Visit: Payer: Self-pay

## 2016-08-08 ENCOUNTER — Ambulatory Visit (HOSPITAL_COMMUNITY): Payer: Medicare Other | Attending: Cardiovascular Disease

## 2016-08-08 DIAGNOSIS — R634 Abnormal weight loss: Secondary | ICD-10-CM

## 2016-08-08 DIAGNOSIS — I071 Rheumatic tricuspid insufficiency: Secondary | ICD-10-CM | POA: Diagnosis not present

## 2016-08-08 DIAGNOSIS — R079 Chest pain, unspecified: Secondary | ICD-10-CM

## 2016-08-09 ENCOUNTER — Other Ambulatory Visit (INDEPENDENT_AMBULATORY_CARE_PROVIDER_SITE_OTHER): Payer: Medicare Other

## 2016-08-09 ENCOUNTER — Ambulatory Visit (INDEPENDENT_AMBULATORY_CARE_PROVIDER_SITE_OTHER): Payer: Medicare Other | Admitting: Internal Medicine

## 2016-08-09 ENCOUNTER — Encounter: Payer: Self-pay | Admitting: Internal Medicine

## 2016-08-09 ENCOUNTER — Ambulatory Visit (INDEPENDENT_AMBULATORY_CARE_PROVIDER_SITE_OTHER)
Admission: RE | Admit: 2016-08-09 | Discharge: 2016-08-09 | Disposition: A | Discharge status: Home / Self Care | Payer: Medicare Other | Source: Ambulatory Visit | Attending: Internal Medicine | Admitting: Internal Medicine

## 2016-08-09 VITALS — BP 144/86 | HR 113 | Temp 97.7°F | Resp 20 | Wt 78.0 lb

## 2016-08-09 DIAGNOSIS — F17200 Nicotine dependence, unspecified, uncomplicated: Secondary | ICD-10-CM

## 2016-08-09 DIAGNOSIS — R1033 Periumbilical pain: Secondary | ICD-10-CM

## 2016-08-09 DIAGNOSIS — R634 Abnormal weight loss: Secondary | ICD-10-CM

## 2016-08-09 DIAGNOSIS — R05 Cough: Secondary | ICD-10-CM | POA: Diagnosis not present

## 2016-08-09 DIAGNOSIS — R0602 Shortness of breath: Secondary | ICD-10-CM | POA: Diagnosis not present

## 2016-08-09 DIAGNOSIS — R109 Unspecified abdominal pain: Secondary | ICD-10-CM | POA: Insufficient documentation

## 2016-08-09 LAB — CBC WITH DIFFERENTIAL/PLATELET
Basophils Absolute: 0.1 10*3/uL (ref 0.0–0.1)
Basophils Relative: 0.6 % (ref 0.0–3.0)
EOS PCT: 1.4 % (ref 0.0–5.0)
Eosinophils Absolute: 0.1 10*3/uL (ref 0.0–0.7)
HCT: 39.7 % (ref 36.0–46.0)
HEMOGLOBIN: 13.2 g/dL (ref 12.0–15.0)
LYMPHS ABS: 1.6 10*3/uL (ref 0.7–4.0)
LYMPHS PCT: 15 % (ref 12.0–46.0)
MCHC: 33.3 g/dL (ref 30.0–36.0)
MCV: 91.9 fl (ref 78.0–100.0)
MONOS PCT: 8.7 % (ref 3.0–12.0)
Monocytes Absolute: 0.9 10*3/uL (ref 0.1–1.0)
NEUTROS PCT: 74.3 % (ref 43.0–77.0)
Neutro Abs: 7.7 10*3/uL (ref 1.4–7.7)
Platelets: 362 10*3/uL (ref 150.0–400.0)
RBC: 4.32 Mil/uL (ref 3.87–5.11)
RDW: 12.8 % (ref 11.5–15.5)
WBC: 10.4 10*3/uL (ref 4.0–10.5)

## 2016-08-09 LAB — COMPREHENSIVE METABOLIC PANEL
ALBUMIN: 3.8 g/dL (ref 3.5–5.2)
ALK PHOS: 76 U/L (ref 39–117)
ALT: 10 U/L (ref 0–35)
AST: 14 U/L (ref 0–37)
BILIRUBIN TOTAL: 0.3 mg/dL (ref 0.2–1.2)
BUN: 21 mg/dL (ref 6–23)
CO2: 36 mEq/L — ABNORMAL HIGH (ref 19–32)
Calcium: 10.1 mg/dL (ref 8.4–10.5)
Chloride: 101 mEq/L (ref 96–112)
Creatinine, Ser: 0.55 mg/dL (ref 0.40–1.20)
GFR: 136.89 mL/min (ref >60.00)
GLUCOSE: 130 mg/dL — AB (ref 70–99)
Potassium: 3.7 mEq/L (ref 3.5–5.1)
Sodium: 142 mEq/L (ref 135–145)
TOTAL PROTEIN: 7.6 g/dL (ref 6.0–8.3)

## 2016-08-09 NOTE — Addendum Note (Signed)
Addended by: Binnie Rail on: 08/09/2016 01:32 PM   Modules accepted: Orders

## 2016-08-09 NOTE — Addendum Note (Signed)
Addended by: Binnie Rail on: 08/09/2016 01:31 PM   Modules accepted: Orders

## 2016-08-09 NOTE — Patient Instructions (Addendum)
  Have a chest x-ray today.  A CT scan of your abdomen was ordered - you will be called today to get that done in the next couple of days.    Medications reviewed and updated.  No changes recommended at this time.

## 2016-08-09 NOTE — Assessment & Plan Note (Signed)
Chronic, but worse Concern with recent weight loss as well Will check Ct A/P May need GI referral

## 2016-08-09 NOTE — Assessment & Plan Note (Signed)
She lost 5 lbs after not eating for several days due to a severe URI Appetite has improved and she is now eating Samples of ensure/boost given Will have close f/u to monitor weight

## 2016-08-09 NOTE — Progress Notes (Signed)
Subjective:    Patient ID: Rhonda Reynolds, female    DOB: 1934/09/27, 82 y.o.   MRN: [id number]  HPI She is here for an acute visit.   URI, SOB:  She had a recent URI that started 10 days ago.  She was very sick and was bed ridden.  She was too weak to get out of bed and she has barely been eating.  She was not able to get up and eat.  She has lost 5 lbs since she was here last.  She now feels her appetite is better.  She is still coughing up clear sputum.  She has SOB that is worse than usual.  She has wheeze.  She denies cold symptoms.    She smoked her last two cigarettes yesterday. She plans on quitting. She will try using nicotine patches to help.   Abdominal pain: She continues to have upper abdominal pain. She has had this for years, but it has gotten worse.  She denies nausea, constipation, diarrhea and blood in the stool.  She denies fevers.  Medications and allergies reviewed with patient and updated if appropriate.  Patient Active Problem List   Diagnosis Date Noted  . Palpable abdominal aorta 07/24/2016  . Diabetes mellitus without complication ([place]) 41/96/2229  . Chest pain 06/25/2016  . Protein-calorie malnutrition ([place]) 12/19/2015  . Sensorineural hearing loss of both ears 12/19/2015  . Weight loss 12/19/2015  . Tobacco use disorder 12/19/2015  . History of hyperthyroidism 12/19/2015  . Essential hypertension 12/18/2015  . PAD (peripheral artery disease) ([place]) 12/18/2015  . Osteoporosis 12/18/2015  . History of left breast cancer 12/18/2015  . Neoplasm of left breast, primary tumor staging category Tis: ductal carcinoma in situ (DCIS) 03/25/2013    Current Outpatient Prescriptions on File Prior to Visit  Medication Sig Dispense Refill  . alendronate (FOSAMAX) 70 MG tablet Take 1 tablet (70 mg total) by mouth every 7 (seven) days. Take with a full glass of water on an empty stomach. 4 tablet 11  . aspirin EC 81 MG tablet Take 81 mg by mouth daily.    .  carboxymethylcellulose (REFRESH PLUS) 0.5 % SOLN 1 drop 3 (three) times daily as needed.    . cholecalciferol (VITAMIN D) 1000 UNITS tablet Take 3,000 Units by mouth daily.    . cycloSPORINE (RESTASIS) 0.05 % ophthalmic emulsion Place 1 drop into both eyes 2 (two) times daily. 0.4 mL 5  . fluorometholone (FML) 0.1 % ophthalmic suspension   0  . hyaluronate sodium (RADIAPLEXRX) GEL Apply 1 application topically 2 (two) times daily.     No current facility-administered medications on file prior to visit.     Past Medical History:  Diagnosis Date  . Arthritis   . Breast cancer ([place])    left  . Diabetes mellitus without complication ([place])   . GERD (gastroesophageal reflux disease)    occ  . History of radiation therapy 04/15/13-05/12/13   42.5 gray to left breast. lumpectomy cavity to 50 gray  . Hyperlipidemia   . Hypertension   . Hyperthyroidism   . Osteoporosis   . Personal history of radiation therapy 2015    Past Surgical History:  Procedure Laterality Date  . ABDOMINAL HYSTERECTOMY    . BREAST LUMPECTOMY Left 03/04/2013  . BREAST LUMPECTOMY WITH NEEDLE LOCALIZATION Left 03/04/2013   Procedure: LEFT BREAST LUMPECTOMY WITH NEEDLE LOCALIZATION;  Surgeon: Imogene Burn. Georgette Dover, MD;  Location: [facility];  Service: General;  Laterality: Left;  .  BREAST SURGERY    . EYE SURGERY Bilateral 14  . SPINE SURGERY  59    Social History   Social History  . Marital status: Single    Spouse name: N/A  . Number of children: 4  . Years of education: N/A   Social History Main Topics  . Smoking status: Current Every Day Smoker    Packs/day: 0.50    Years: 40.00    Types: Cigarettes  . Smokeless tobacco: Never Used  . Alcohol use No  . Drug use: No  . Sexual activity: Not on file   Other Topics Concern  . Not on file   Social History Narrative   Widowed   Retired Control and instrumentation engineer       Family History  Problem Relation Age of Onset  . Cancer Mother        pt canot remember  .  Diabetes Mother   . Stroke Mother   . Heart failure Father   . Lung cancer Brother     Review of Systems  Constitutional: Positive for appetite change (decreased, but improving) and unexpected weight change. Negative for fever.  HENT: Negative for congestion, ear pain (ears plug intermittent), sinus pain, sinus pressure and sore throat.   Respiratory: Positive for cough (clear sputum), shortness of breath (with talking or exerting herself) and wheezing (occasional).   Cardiovascular: Negative for chest pain, palpitations and leg swelling.  Gastrointestinal: Positive for abdominal pain. Negative for blood in stool, constipation, diarrhea and nausea.  Genitourinary: Negative for dysuria and hematuria.  Neurological: Negative for light-headedness and headaches.       Objective:   Vitals:   08/09/16 1005  BP: (!) 144/86  Pulse: (!) 113  Resp: 20  Temp: 97.7 F (36.5 C)   Filed Weights   08/09/16 1005  Weight: 78 lb (35.4 kg)   Body mass index is 12.22 kg/m.  Wt Readings from Last 3 Encounters:  08/09/16 78 lb (35.4 kg)  07/24/16 83 lb (37.6 kg)  06/25/16 85 lb (38.6 kg)     Physical Exam GENERAL APPEARANCE: cachetic appearing, NAD EYES: conjunctiva clear, no icterus HEENT: bilateral tympanic membranes and ear canals normal, oropharynx with mild erythema, no thyromegaly, trachea midline, no cervical or supraclavicular lymphadenopathy LUNGS: diffusely decreased BS, no crackles, no wheeze Abdomen: tenderness with light palpation in epigastric and periumbilical area with guarding, no rebound, soft, no distended HEART: Normal S1,S2 without murmurs EXTREMITIES: Without clubbing, cyanosis, or edema        Assessment & Plan:   See Problem List for Assessment and Plan of chronic medical problems.

## 2016-08-09 NOTE — Assessment & Plan Note (Signed)
Increased compared to baseline Will stop smoking today Will check CXR May need to start an inhaler - will avoid steroids due to osteoporosis

## 2016-08-09 NOTE — Assessment & Plan Note (Signed)
She smoked her last two cigarettes yesterday and plans on using nicotine patches to quit

## 2016-08-10 ENCOUNTER — Ambulatory Visit (INDEPENDENT_AMBULATORY_CARE_PROVIDER_SITE_OTHER)
Admission: RE | Admit: 2016-08-10 | Discharge: 2016-08-10 | Disposition: A | Discharge status: Home / Self Care | Payer: Medicare Other | Source: Ambulatory Visit | Attending: Internal Medicine | Admitting: Internal Medicine

## 2016-08-10 DIAGNOSIS — R1033 Periumbilical pain: Secondary | ICD-10-CM | POA: Diagnosis not present

## 2016-08-10 DIAGNOSIS — R109 Unspecified abdominal pain: Secondary | ICD-10-CM | POA: Diagnosis not present

## 2016-08-10 DIAGNOSIS — R634 Abnormal weight loss: Secondary | ICD-10-CM

## 2016-08-10 MED ORDER — IOPAMIDOL (ISOVUE-300) INJECTION 61%
100.0000 mL | Freq: Once | INTRAVENOUS | Status: AC | PRN
  Administered 2016-08-10: 75 mL via INTRAVENOUS

## 2016-08-11 ENCOUNTER — Telehealth: Payer: Self-pay | Admitting: Internal Medicine

## 2016-08-11 NOTE — Telephone Encounter (Signed)
Pt called wanting to leave a message stating she would like to try the inhaler Please send rx to [facility] on [address]

## 2016-08-12 MED ORDER — TIOTROPIUM BROMIDE MONOHYDRATE 18 MCG IN CAPS: 18.0000 ug | ORAL_CAPSULE | Freq: Every day | RESPIRATORY_TRACT | 12 refills | Status: DC

## 2016-08-12 NOTE — Telephone Encounter (Signed)
rx sent

## 2016-08-16 ENCOUNTER — Telehealth: Payer: Self-pay | Admitting: Internal Medicine

## 2016-08-16 NOTE — Telephone Encounter (Signed)
See note from ct of ab/pelvix - Ct scan shows benign appearing right ovarian cyst - we should consider getting a transvaginal US to assess further. Her abdominal aorta is tortuous and we should be a abdominal US in 5 years to reassess - she can cancel her upcoming [place]. She has a patchy infiltrate in her lower lung - we need to get a CT of her lungs scan to evaluate better.  We will get approval for Korea and Ct scan we just need to know if she is ok with having it so I can order them

## 2016-08-16 NOTE — Telephone Encounter (Signed)
Pt states on her last procedure she was told by a nurse the doctor thought they saw something and wanted to do an MRI for further testing and she was told to ask her insurance if they will cover the MRI. She wants to know what the doctor thought they saw. I could not find any notes.  Please call Pt back

## 2016-08-17 ENCOUNTER — Encounter: Payer: Self-pay | Admitting: Emergency Medicine

## 2016-08-17 ENCOUNTER — Inpatient Hospital Stay (HOSPITAL_COMMUNITY): Admission: RE | Admit: 2016-08-17 | Payer: Medicare Other | Source: Ambulatory Visit

## 2016-08-17 NOTE — Telephone Encounter (Signed)
Spoke with pt to clarify. Printed letter with results and placed upfront for pick-up.

## 2016-08-30 ENCOUNTER — Telehealth: Payer: Self-pay | Admitting: Internal Medicine

## 2016-08-30 DIAGNOSIS — R9389 Abnormal findings on diagnostic imaging of other specified body structures: Secondary | ICD-10-CM

## 2016-08-30 DIAGNOSIS — I771 Stricture of artery: Secondary | ICD-10-CM

## 2016-08-30 DIAGNOSIS — N83201 Unspecified ovarian cyst, right side: Secondary | ICD-10-CM

## 2016-08-30 NOTE — Telephone Encounter (Signed)
Please advise, im not sure what test she is referring to.

## 2016-08-30 NOTE — Telephone Encounter (Signed)
See last phone note - I assume she is ok with the tests being ordered -- ordered now.

## 2016-08-30 NOTE — Telephone Encounter (Signed)
Patient called in yesterday stating that she had a family emergency and will be out of town through the end of month.  Patient states that she is 90% stronger than she was.  States she was waiting on a call for testing.  But will be out of town.

## 2016-09-02 ENCOUNTER — Other Ambulatory Visit: Payer: Self-pay | Admitting: Internal Medicine

## 2016-09-02 DIAGNOSIS — N83209 Unspecified ovarian cyst, unspecified side: Secondary | ICD-10-CM

## 2016-09-05 NOTE — Telephone Encounter (Signed)
LVM informing pt to call [facility] when she comes back into town.

## 2016-09-18 ENCOUNTER — Encounter: Payer: Self-pay | Admitting: Internal Medicine

## 2016-10-03 ENCOUNTER — Ambulatory Visit
Admission: RE | Admit: 2016-10-03 | Discharge: 2016-10-03 | Disposition: A | Discharge status: Home / Self Care | Payer: Medicare Other | Source: Ambulatory Visit | Attending: Internal Medicine | Admitting: Internal Medicine

## 2016-10-03 DIAGNOSIS — J439 Emphysema, unspecified: Secondary | ICD-10-CM | POA: Diagnosis not present

## 2016-10-03 DIAGNOSIS — N83201 Unspecified ovarian cyst, right side: Secondary | ICD-10-CM

## 2016-10-03 DIAGNOSIS — R9389 Abnormal findings on diagnostic imaging of other specified body structures: Secondary | ICD-10-CM

## 2016-10-03 DIAGNOSIS — N83209 Unspecified ovarian cyst, unspecified side: Secondary | ICD-10-CM

## 2016-10-04 ENCOUNTER — Other Ambulatory Visit: Payer: Self-pay | Admitting: Internal Medicine

## 2016-10-04 ENCOUNTER — Encounter: Payer: Self-pay | Admitting: Internal Medicine

## 2016-10-04 DIAGNOSIS — N83209 Unspecified ovarian cyst, unspecified side: Secondary | ICD-10-CM | POA: Insufficient documentation

## 2016-10-04 DIAGNOSIS — R9389 Abnormal findings on diagnostic imaging of other specified body structures: Secondary | ICD-10-CM

## 2016-10-04 NOTE — Assessment & Plan Note (Signed)
CT scan 09/2016: Mildly enlarged subcarinal lymph node. Potentially reactive, but follow-up CT in 3 months recommended to ensure stability.  CT scan for follow-up ordered for October 2018

## 2016-10-29 ENCOUNTER — Encounter: Payer: Self-pay | Admitting: Family Medicine

## 2016-10-29 ENCOUNTER — Ambulatory Visit (INDEPENDENT_AMBULATORY_CARE_PROVIDER_SITE_OTHER)
Admission: RE | Admit: 2016-10-29 | Discharge: 2016-10-29 | Disposition: A | Discharge status: Home / Self Care | Payer: Medicare Other | Source: Ambulatory Visit | Attending: Family Medicine | Admitting: Family Medicine

## 2016-10-29 ENCOUNTER — Ambulatory Visit (INDEPENDENT_AMBULATORY_CARE_PROVIDER_SITE_OTHER): Payer: Medicare Other | Admitting: Family Medicine

## 2016-10-29 ENCOUNTER — Telehealth: Payer: Self-pay | Admitting: Family Medicine

## 2016-10-29 ENCOUNTER — Ambulatory Visit: Payer: Medicare Other | Admitting: Family Medicine

## 2016-10-29 VITALS — BP 152/94 | HR 104 | Temp 98.1°F | Ht 67.0 in | Wt 88.0 lb

## 2016-10-29 DIAGNOSIS — S82402A Unspecified fracture of shaft of left fibula, initial encounter for closed fracture: Secondary | ICD-10-CM

## 2016-10-29 DIAGNOSIS — M25572 Pain in left ankle and joints of left foot: Secondary | ICD-10-CM

## 2016-10-29 DIAGNOSIS — S8262XA Displaced fracture of lateral malleolus of left fibula, initial encounter for closed fracture: Secondary | ICD-10-CM | POA: Diagnosis not present

## 2016-10-29 HISTORY — DX: Unspecified fracture of shaft of left fibula, initial encounter for closed fracture: S82.402A

## 2016-10-29 NOTE — Assessment & Plan Note (Signed)
Korea and exam are suggestive of lateral malleolus fracture.  - xray  - f/u in one week to repeat xrays - placed in CAM walker today.  - pain control with tylenol

## 2016-10-29 NOTE — Telephone Encounter (Signed)
Left VM for patient. If she calls back please have her speak with a nurse/CMA and inform that she has an fracture of her lateral malleolus and to follow up with me in one week.   If any questions then please take the best time and phone number to call and I will try to call her back.   Rosemarie Ax, MD [facility] 10/29/2016, 4:49 PM

## 2016-10-29 NOTE — Progress Notes (Signed)
Rhonda Reynolds - 82 y.o. female MRN [id number]  Date of birth: 28-Feb-1935  SUBJECTIVE:  Including CC & ROS.  Chief Complaint  Patient presents with  . Ankle Pain    happened saturday. Patient hurt ankle playing basketball     Rhonda Reynolds is a 82 year old female is presenting with left ankle pain. She reports she was pivoting this week and will playing basketball. When this was occurring her left ankle gave out at that time. She is unsure how she rolled her ankle. She now has lateral ankle pain with significant swelling. She is having to ambulate with a cane which she does not normally do. She does not have any pain if she walks on her forefoot. She has taken some over-the-counter medications for pain. The pain is significant she puts her weight on her heel. She denies any history of prior ankle problems.     Review of Systems  Cardiovascular: Positive for leg swelling.  Musculoskeletal: Positive for gait problem.  Skin: Negative for rash.  otherwise negative.   HISTORY: Past Medical, Surgical, Social, and Family History Reviewed & Updated per EMR.   Pertinent Historical Findings include:  Past Medical History:  Diagnosis Date  . Arthritis   . Breast cancer ([place])    left  . Diabetes mellitus without complication ([place])   . GERD (gastroesophageal reflux disease)    occ  . History of radiation therapy 04/15/13-05/12/13   42.5 gray to left breast. lumpectomy cavity to 50 gray  . Hyperlipidemia   . Hypertension   . Hyperthyroidism   . Osteoporosis   . Personal history of radiation therapy 2015    Past Surgical History:  Procedure Laterality Date  . ABDOMINAL HYSTERECTOMY    . BREAST LUMPECTOMY Left 03/04/2013  . BREAST LUMPECTOMY WITH NEEDLE LOCALIZATION Left 03/04/2013   Procedure: LEFT BREAST LUMPECTOMY WITH NEEDLE LOCALIZATION;  Surgeon: Imogene Burn. Georgette Dover, MD;  Location: [facility];  Service: General;  Laterality: Left;  . BREAST SURGERY    . EYE SURGERY Bilateral 14  . SPINE  SURGERY  88    Allergies  Allergen Reactions  . Forteo [Teriparatide (Recombinant)]     Stomach problems    Family History  Problem Relation Age of Onset  . Cancer Mother        pt canot remember  . Diabetes Mother   . Stroke Mother   . Heart failure Father   . Lung cancer Brother      Social History   Social History  . Marital status: Single    Spouse name: N/A  . Number of children: 4  . Years of education: N/A   Occupational History  . Not on file.   Social History Main Topics  . Smoking status: Current Every Day Smoker    Packs/day: 0.50    Years: 40.00    Types: Cigarettes  . Smokeless tobacco: Never Used  . Alcohol use No  . Drug use: No  . Sexual activity: Not on file   Other Topics Concern  . Not on file   Social History Narrative   Widowed   Retired Control and instrumentation engineer        PHYSICAL EXAM:  VS: BP (!) 152/94 (BP Location: Right Arm, Patient Position: Sitting, Cuff Size: Normal)   Pulse (!) 104   Temp 98.1 F (36.7 C) (Oral)   Ht 5\' 7"  (1.702 m)   Wt 88 lb (39.9 kg)   SpO2 93%   BMI 13.78 kg/m  Physical Exam  Gen: NAD, alert, cooperative with exam, well-appearing ENT: normal lips, normal nasal mucosa,  Eye: PERRL, normal conjunctiva and lids CV:  no edema, +2 pedal pulses   Resp: no accessory muscle use, non-labored,  Skin: no rashes, no areas of induration  Neuro: normal tone, normal sensation to touch Psych:  normal insight, alert and oriented MSK:  Left ankle: Obvious swelling of the dorsal foot and lateral ankle. Tenderness to palpation of the lateral malleolus. No tenderness to palpation of the medial malleolus, navicular, or base of the fifth metatarsal. Unable to ambulate without pain and has to use a cane. Normal range of motion. Anterior translation with reproduction of pain. Neurovascularly intact.  Limited ultrasound: left ankle:  Lateral malleolus with a step-off to suggest a fracture. Achilles tendon is  normal. Peroneal brevis insertion into the fifth metatarsal base is normal. Peroneal tendons intact around the lateral malleolus  Summary: Findings are consistent with a lateral malleolus fracture.   Ultrasound and interpretation by Clearance Coots, MD            ASSESSMENT & PLAN:   Acute left ankle pain Korea and exam are suggestive of lateral malleolus fracture.  - xray  - f/u in one week to repeat xrays - placed in CAM walker today.  - pain control with tylenol

## 2016-10-29 NOTE — Patient Instructions (Signed)
Thank you for coming in,   After the x-ray you can come back up to the office and we can review your images.   Please feel free to call with any questions or concerns at any time, at [phone]. --Dr. Raeford Razor

## 2016-11-06 ENCOUNTER — Encounter: Payer: Self-pay | Admitting: Family Medicine

## 2016-11-06 ENCOUNTER — Ambulatory Visit (INDEPENDENT_AMBULATORY_CARE_PROVIDER_SITE_OTHER)
Admission: RE | Admit: 2016-11-06 | Discharge: 2016-11-06 | Disposition: A | Discharge status: Home / Self Care | Payer: Medicare Other | Source: Ambulatory Visit | Attending: Family Medicine | Admitting: Family Medicine

## 2016-11-06 ENCOUNTER — Ambulatory Visit (INDEPENDENT_AMBULATORY_CARE_PROVIDER_SITE_OTHER): Payer: Medicare Other | Admitting: Family Medicine

## 2016-11-06 VITALS — BP 160/98 | HR 88 | Temp 97.8°F | Ht 67.0 in | Wt 88.0 lb

## 2016-11-06 DIAGNOSIS — S8265XD Nondisplaced fracture of lateral malleolus of left fibula, subsequent encounter for closed fracture with routine healing: Secondary | ICD-10-CM

## 2016-11-06 DIAGNOSIS — M25572 Pain in left ankle and joints of left foot: Secondary | ICD-10-CM | POA: Diagnosis not present

## 2016-11-06 DIAGNOSIS — S82832A Other fracture of upper and lower end of left fibula, initial encounter for closed fracture: Secondary | ICD-10-CM | POA: Diagnosis not present

## 2016-11-06 NOTE — Assessment & Plan Note (Signed)
I have independently reviewed reviewed her x-rays of her left ankle from her last visit which showed a fibular fracture. She has done well with the baby with little to no pain. - Continue her walking boot. - Tylenol as needed for pain - Follow-up in 2 weeks. If the x-ray today show her joint and bone still being well aligned then we will continue our conservative management.

## 2016-11-06 NOTE — Progress Notes (Addendum)
Rhonda Reynolds - 82 y.o. female MRN [id number]  Date of birth: 20-Jan-1935  SUBJECTIVE:  Including CC & ROS.  Chief Complaint  Patient presents with  . Follow-up    Patient states ankle feels a little better but cannot keep boot on all day as it is heavy.    Rhonda Reynolds is a 82 year old female that is following up for a left fibular fracture. She sustained this while playing basketball about 10 days ago. She reports little to no pain. Not even taking any pain medication. She is using petroleum jelly for the pain. She keeps her leg elevated at night. Has been compliant with boot she is using a cane to help ambulate. She is not currently taking any vitamin D or calcium.     Review of Systems  Musculoskeletal: Positive for gait problem and joint swelling.  Skin: Negative for rash.  Neurological: Negative for weakness and numbness.    HISTORY: Past Medical, Surgical, Social, and Family History Reviewed & Updated per EMR.   Pertinent Historical Findings include:  Past Medical History:  Diagnosis Date  . Arthritis   . Breast cancer ([place])    left  . Diabetes mellitus without complication ([place])   . GERD (gastroesophageal reflux disease)    occ  . History of radiation therapy 04/15/13-05/12/13   42.5 gray to left breast. lumpectomy cavity to 50 gray  . Hyperlipidemia   . Hypertension   . Hyperthyroidism   . Osteoporosis   . Personal history of radiation therapy 2015    Past Surgical History:  Procedure Laterality Date  . ABDOMINAL HYSTERECTOMY    . BREAST LUMPECTOMY Left 03/04/2013  . BREAST LUMPECTOMY WITH NEEDLE LOCALIZATION Left 03/04/2013   Procedure: LEFT BREAST LUMPECTOMY WITH NEEDLE LOCALIZATION;  Surgeon: Imogene Burn. Georgette Dover, MD;  Location: [facility];  Service: General;  Laterality: Left;  . BREAST SURGERY    . EYE SURGERY Bilateral 14  . SPINE SURGERY  88    Allergies  Allergen Reactions  . Forteo [Teriparatide (Recombinant)]     Stomach problems    Family History    Problem Relation Age of Onset  . Cancer Mother        pt canot remember  . Diabetes Mother   . Stroke Mother   . Heart failure Father   . Lung cancer Brother      Social History   Social History  . Marital status: Single    Spouse name: N/A  . Number of children: 4  . Years of education: N/A   Occupational History  . Not on file.   Social History Main Topics  . Smoking status: Current Every Day Smoker    Packs/day: 0.50    Years: 40.00    Types: Cigarettes  . Smokeless tobacco: Never Used  . Alcohol use No  . Drug use: No  . Sexual activity: Not on file   Other Topics Concern  . Not on file   Social History Narrative   Widowed   Retired Control and instrumentation engineer        PHYSICAL EXAM:  VS: BP (!) 160/98 (BP Location: Right Arm, Patient Position: Sitting, Cuff Size: Normal)   Pulse 88   Temp 97.8 F (36.6 C) (Oral)   Ht 5\' 7"  (1.702 m)   Wt 88 lb (39.9 kg) Comment: With boot on foot  SpO2 94%   BMI 13.78 kg/m  Physical Exam Gen: NAD, alert, cooperative with exam, well-appearing ENT: normal lips, normal nasal mucosa,  Eye:  normal EOM, normal conjunctiva and lids CV:  Normal rate, +2 pedal pulses   Resp: no accessory muscle use, non-labored,  Skin: no rashes, no areas of induration  Neuro: normal tone, normal sensation to touch Psych:  normal insight, alert and oriented MSK:  Left foot: Swelling still apparent around the lateral malleolus. Some tennis to palpation of the lateral malleolus. No overlying bruising. Sensation intact. Neurovascularly intact.       ASSESSMENT & PLAN:   Left fibular fracture I have independently reviewed reviewed her x-rays of her left ankle from her last visit which showed a fibular fracture. She has done well with the baby with little to no pain. - Continue her walking boot. - Tylenol as needed for pain - Follow-up in 2 weeks. If the x-ray today show her joint and bone still being well aligned then we will continue  our conservative management.

## 2016-11-06 NOTE — Patient Instructions (Addendum)
Thank you for coming in,   Please follow-up with me in 2 weeks. He can take Tylenol for pain. Please restart her vitamin D.   Please feel free to call with any questions or concerns at any time, at [phone]. --Dr. Raeford Razor

## 2016-11-12 ENCOUNTER — Encounter: Payer: Self-pay | Admitting: Cardiovascular Disease

## 2016-11-12 ENCOUNTER — Ambulatory Visit (INDEPENDENT_AMBULATORY_CARE_PROVIDER_SITE_OTHER): Payer: Medicare Other | Admitting: Cardiovascular Disease

## 2016-11-12 VITALS — BP 140/90 | HR 88 | Ht 66.0 in | Wt 87.8 lb

## 2016-11-12 DIAGNOSIS — I10 Essential (primary) hypertension: Secondary | ICD-10-CM

## 2016-11-12 NOTE — Progress Notes (Signed)
Cardiology Office Note   Date:  11/12/2016   ID:  Rhonda, Reynolds 03/15/1935, MRN [id number]  PCP:  Binnie Rail, MD  Cardiologist:   Mertie Moores, MD   Chief Complaint  Patient presents with  . Follow-up    CHEST PAIN   Problem list 1. Essential hypertension 2. Peripheral arterial disease 3. History of hyperthyroidism 4. Diabetes mellitus  5.  Hx of left  breast cancer     History of Present Illness:  Rhonda Reynolds is a 82 y.o. female who is being seen today for the evaluation of chest pain  at the request of Burns, Stacy J, MD.  Pt reports an episode of chest pain while at church about 2 months ago. The pain lasted a few few seconds and then she reported being sore for several hours.  She did not go to the emergency room.   The pain was not pleuritic. She's not had any further episodes of chest pain.  Has been losing weight . Still smokes - 1 ppd , now smokes  Uses nicotine patches on occasion - but the recent patches caused a rash on her chest    Aug. 20, 2018:  Doing well. Was playing basketball and fractured her left ankle  Avoids salt Does Not check her blood pressure at home. She's not had any episodes of chest pain or shortness of breath. We performed an echocardiogram on May 16 which reveals normal left ventricular systolic and diastolic function.  Past Medical History:  Diagnosis Date  . Arthritis   . Breast cancer ([place])    left  . Diabetes mellitus without complication ([place])   . GERD (gastroesophageal reflux disease)    occ  . History of radiation therapy 04/15/13-05/12/13   42.5 gray to left breast. lumpectomy cavity to 50 gray  . Hyperlipidemia   . Hypertension   . Hyperthyroidism   . Osteoporosis   . Personal history of radiation therapy 2015    Past Surgical History:  Procedure Laterality Date  . ABDOMINAL HYSTERECTOMY    . BREAST LUMPECTOMY Left 03/04/2013  . BREAST LUMPECTOMY WITH NEEDLE LOCALIZATION Left 03/04/2013   Procedure: LEFT BREAST LUMPECTOMY WITH NEEDLE LOCALIZATION;  Surgeon: Imogene Burn. Georgette Dover, MD;  Location: [facility];  Service: General;  Laterality: Left;  . BREAST SURGERY    . EYE SURGERY Bilateral 14  . SPINE SURGERY  88     Current Outpatient Prescriptions  Medication Sig Dispense Refill  . alendronate (FOSAMAX) 70 MG tablet Take 1 tablet (70 mg total) by mouth every 7 (seven) days. Take with a full glass of water on an empty stomach. 4 tablet 11  . aspirin EC 81 MG tablet Take 81 mg by mouth daily.    . carboxymethylcellulose (REFRESH PLUS) 0.5 % SOLN 1 drop 3 (three) times daily as needed (DRY EYE).     . cholecalciferol (VITAMIN D) 1000 UNITS tablet Take 3,000 Units by mouth daily.    . cycloSPORINE (RESTASIS) 0.05 % ophthalmic emulsion Place 1 drop into both eyes 2 (two) times daily. 0.4 mL 5  . hyaluronate sodium (RADIAPLEXRX) GEL Apply 1 application topically 2 (two) times daily.     No current facility-administered medications for this visit.     PAD Screen 07/24/2016  Previous PAD dx? No  Previous surgical procedure? No  Pain with walking? No  Feet/toe relief with dangling? No  Painful, non-healing ulcers? No  Extremities discolored? No      Allergies:  Forteo [teriparatide (recombinant)]    Social History:  The patient  reports that she has been smoking Cigarettes.  She has a 20.00 pack-year smoking history. She has never used smokeless tobacco. She reports that she does not drink alcohol or use drugs.   Family History:  The patient's family history includes Cancer in her mother; Diabetes in her mother; Heart failure in her father; Lung cancer in her brother; Stroke in her mother.    ROS:  Please see the history of present illness.    Review of Systems: Constitutional:  denies fever, chills, diaphoresis, appetite change and fatigue.  HEENT: denies photophobia, eye pain, redness, hearing loss, ear pain, congestion, sore throat, rhinorrhea, sneezing, neck pain, neck  stiffness and tinnitus.  Respiratory: denies SOB, DOE, cough, chest tightness, and wheezing.  Cardiovascular: admits to chest pain.  Gastrointestinal: denies nausea, vomiting, abdominal pain, diarrhea, constipation, blood in stool.  Genitourinary: denies dysuria, urgency, frequency, hematuria, flank pain and difficulty urinating.  Musculoskeletal: denies  myalgias, back pain, joint swelling, arthralgias and gait problem.   Skin: denies pallor, rash and wound.  Neurological: denies dizziness, seizures, syncope, weakness, light-headedness, numbness and headaches.   Hematological: denies adenopathy, easy bruising, personal or family bleeding history.  Psychiatric/ Behavioral: denies suicidal ideation, mood changes, confusion, nervousness, sleep disturbance and agitation.       All other systems are reviewed and negative.    PHYSICAL EXAM: VS:  BP 140/90   Pulse 88   Ht 5\' 6"  (1.676 m)   Wt 87 lb 12.8 oz (39.8 kg)   BMI 14.17 kg/m  , BMI Body mass index is 14.17 kg/m. GEN: thin, wasted elderly female  HEENT: normal  Neck: no JVD, carotid bruits, or masses Cardiac: RRR; no murmurs, rubs, or gallops,no edema  Respiratory:  clear to auscultation bilaterally, normal work of breathing GI: thin,  pulsitile abdominal aorta  MS: no deformity or atrophy ,  is wearing a boot on her left foot. Skin: warm and dry, no rash Neuro:  Strength and sensation are intact Psych: normal   EKG:  EKG is ordered today. The ekg ordered today demonstrates  NSR at 96.   Pulmonary artery pattern, inc. RBBB    Recent Labs: 07/24/2016: TSH 1.510 08/09/2016: ALT 10; BUN 21; Creatinine, Ser 0.55; Hemoglobin 13.2; Platelets 362.0; Potassium 3.7; Sodium 142    Lipid Panel    Component Value Date/Time   CHOL 174 06/25/2016 0850   TRIG 56.0 06/25/2016 0850   HDL 67.40 06/25/2016 0850   CHOLHDL 3 06/25/2016 0850   VLDL 11.2 06/25/2016 0850   LDLCALC 96 06/25/2016 0850      Wt Readings from Last 3  Encounters:  11/12/16 87 lb 12.8 oz (39.8 kg)  11/06/16 88 lb (39.9 kg)  10/29/16 88 lb (39.9 kg)      Other studies Reviewed: Additional studies/ records that were reviewed today include: . Review of the above records demonstrates:    ASSESSMENT AND PLAN:  1.  Chest pain :   She has not had any additional chest pain.  Her echocardiogram is normal.  2. HTN -  BP is ok Does not check BP at home .  She'll follow-up with her primary medical doctor. I will see her on an as-needed basis.  3.  Weight loss:   Has continued to lose weight.  She appears wasted .  She has very little muscle mass.  Will check TSH  She does not remember her last colonoscopy   This  may need further evaluation .   4. Pulsatile abdominal aorta: She's quite thin and unable to feel her abdominal aorta. This is not all that unusual and thin patient's but it is of some concern given her history of cigarette smoking. We will get an abdominal duplex scan to evaluate her for the possibility of abdominal aortic aneurysm.    Current medicines are reviewed at length with the patient today.  The patient does not have concerns regarding medicines.  Labs/ tests ordered today include:  No orders of the defined types were placed in this encounter.    Disposition:   FU with me as needed.      Mertie Moores, MD  11/12/2016 8:04 AM    [facility] [address], [place], [place]  [id number] Phone: [phone]; Fax: [phone]

## 2016-11-12 NOTE — Patient Instructions (Signed)
Medication Instructions:  Your physician recommends that you continue on your current medications as directed. Please refer to the Current Medication list given to you today.   Labwork: None Ordered   Testing/Procedures: None Ordered   Follow-Up: Your physician recommends that you schedule a follow-up appointment in: as needed with Dr. Nahser   If you need a refill on your cardiac medications before your next appointment, please call your pharmacy.   Thank you for choosing [facility]! Vickki Igou, RN [phone]    

## 2016-11-20 ENCOUNTER — Encounter: Payer: Self-pay | Admitting: Family Medicine

## 2016-11-20 ENCOUNTER — Ambulatory Visit (INDEPENDENT_AMBULATORY_CARE_PROVIDER_SITE_OTHER): Payer: Medicare Other | Admitting: Family Medicine

## 2016-11-20 VITALS — BP 140/90 | HR 81 | Temp 98.3°F | Ht 66.0 in | Wt 87.0 lb

## 2016-11-20 DIAGNOSIS — S8265XD Nondisplaced fracture of lateral malleolus of left fibula, subsequent encounter for closed fracture with routine healing: Secondary | ICD-10-CM

## 2016-11-20 MED ORDER — VITAMIN D 1000 UNITS PO TABS: 3000.0000 [IU] | ORAL_TABLET | Freq: Every day | ORAL | 1 refills | Status: DC

## 2016-11-20 NOTE — Patient Instructions (Addendum)
Thank you for coming in,   Please follow-up on September 17 or later. Please continue using the walking boot.   Please feel free to call with any questions or concerns at any time, at [phone]. --Dr. Raeford Razor

## 2016-11-20 NOTE — Assessment & Plan Note (Signed)
The anterior cortex appears to be healing. Still signs of the fracture being present - Continue the cam walker. She will follow-up after September 17 which will be 6 weeks out. At that time will determine if she is able to transition out of the cam walker - Could consider an x-ray at that time - Vitamin D sent today.

## 2016-11-20 NOTE — Progress Notes (Signed)
Rhonda Reynolds - 82 y.o. female MRN [id number]  Date of birth: 1934-12-26  SUBJECTIVE:  Including CC & ROS.  Chief Complaint  Patient presents with  . Follow-up    patient states that her left foot has been improving greatly.    This Rhonda Reynolds is a 82 year old female that is following up for a left lateral fibular fracture. She sustained this on 8/4. It has been over 3 weeks since that injury. She has been doing well. She denies any significant pain. She has been using a cam walker during that time. The swelling is significantly improved. She has been walking around her house without her cam walker at times. She is using a 4-point cane to help with ambulation. She denies any significant pain. She is not taking her vitamin D.    Review of her chart shows her vitamin D level being 37 2017. I have independently reviewed her x-ray from 8/14 which shows a distal left fibular fracture. I have independently reviewed the x-rays from 10/29/16 which shows a fracture of the distal fibula.  Review of Systems  Cardiovascular: Negative for leg swelling.  Musculoskeletal: Positive for gait problem. Negative for joint swelling.  Skin: Negative for color change.  Neurological: Negative for weakness and numbness.  Hematological: Negative for adenopathy.    HISTORY: Past Medical, Surgical, Social, and Family History Reviewed & Updated per EMR.   Pertinent Historical Findings include:  Past Medical History:  Diagnosis Date  . Arthritis   . Breast cancer ([place])    left  . Diabetes mellitus without complication ([place])   . GERD (gastroesophageal reflux disease)    occ  . History of radiation therapy 04/15/13-05/12/13   42.5 gray to left breast. lumpectomy cavity to 50 gray  . Hyperlipidemia   . Hypertension   . Hyperthyroidism   . Osteoporosis   . Personal history of radiation therapy 2015    Past Surgical History:  Procedure Laterality Date  . ABDOMINAL HYSTERECTOMY    . BREAST LUMPECTOMY Left  03/04/2013  . BREAST LUMPECTOMY WITH NEEDLE LOCALIZATION Left 03/04/2013   Procedure: LEFT BREAST LUMPECTOMY WITH NEEDLE LOCALIZATION;  Surgeon: Imogene Burn. Georgette Dover, MD;  Location: [place];  Service: General;  Laterality: Left;  . BREAST SURGERY    . EYE SURGERY Bilateral 14  . SPINE SURGERY  88    Allergies  Allergen Reactions  . Forteo [Teriparatide (Recombinant)]     Stomach problems    Family History  Problem Relation Age of Onset  . Cancer Mother        pt canot remember  . Diabetes Mother   . Stroke Mother   . Heart failure Father   . Lung cancer Brother      Social History   Social History  . Marital status: Single    Spouse name: N/A  . Number of children: 4  . Years of education: N/A   Occupational History  . Not on file.   Social History Main Topics  . Smoking status: Current Every Day Smoker    Packs/day: 0.50    Years: 40.00    Types: Cigarettes  . Smokeless tobacco: Never Used  . Alcohol use No  . Drug use: No  . Sexual activity: Not on file   Other Topics Concern  . Not on file   Social History Narrative   Widowed   Retired Control and instrumentation engineer        PHYSICAL EXAM:  VS: BP 140/90 (BP Location: Left Arm, Patient Position:  Sitting, Cuff Size: Normal)   Pulse 81   Temp 98.3 F (36.8 C) (Oral)   Ht 5\' 6"  (1.676 m)   Wt 87 lb (39.5 kg) Comment: With boot  SpO2 99%   BMI 14.04 kg/m  Physical Exam Gen: NAD, alert, cooperative with exam, thin ENT: normal lips, normal nasal mucosa,  Eye: normal EOM, normal conjunctiva and lids CV:  no edema, +2 pedal pulses   Resp: no accessory muscle use, non-labored,  Skin: no rashes, no areas of induration  Neuro: normal tone, normal sensation to touch Psych:  normal insight, alert and oriented MSK:  Left foot: No significant swelling. Some tenderness to palpation over the lateral malleolus. Normal ankle range of motion. Normal strength. No tenderness to palpation of the medial malleolus, navicular,  or base of the fifth metatarsal. Neurovascularly intact.  Limited ultrasound: The lateral malleolus of left leg:  The anterior cortex of the lateral fibula is showing calcification in the form of healing. The lateral aspect does not show any calcification formation.  Summary: Reveals findings of normal healing of the fibular fracture  Ultrasound and interpretation by Clearance Coots, MD             ASSESSMENT & PLAN:   Left fibular fracture The anterior cortex appears to be healing. Still signs of the fracture being present - Continue the cam walker. She will follow-up after September 17 which will be 6 weeks out. At that time will determine if she is able to transition out of the cam walker - Could consider an x-ray at that time - Vitamin D sent today.

## 2016-12-10 ENCOUNTER — Encounter: Payer: Self-pay | Admitting: Family Medicine

## 2016-12-10 ENCOUNTER — Ambulatory Visit (INDEPENDENT_AMBULATORY_CARE_PROVIDER_SITE_OTHER): Payer: Medicare Other | Admitting: Family Medicine

## 2016-12-10 DIAGNOSIS — S8265XD Nondisplaced fracture of lateral malleolus of left fibula, subsequent encounter for closed fracture with routine healing: Secondary | ICD-10-CM | POA: Diagnosis not present

## 2016-12-10 NOTE — Assessment & Plan Note (Signed)
She is 6 weeks out from her injury. There appears to be normal healing. - She will continue use the boot for the next 2 weeks. - She will follow-up at that time and re-x-ray and ultrasound. Likely she will be transitioned out of the boot at that time. It'll be 8 weeks from her injury. - Provided home exercises

## 2016-12-10 NOTE — Progress Notes (Signed)
Rhonda Reynolds - 82 y.o. female MRN [id number]  Date of birth: [birth year]  SUBJECTIVE:  Including CC & ROS.  Chief Complaint  Patient presents with  . Follow-up    Patient is here today to F/U with closed nondisplaced Fx of lateral malleolus of left fibula.     Rhonda Reynolds is an 82 year old female is following up for a closed nondisplaced fracture of her left fibula. She reports that she is doing well and has minimal pain. She has been walking around her house without her boot on. She has little to no swelling anymore. She is not currently taking any medications for pain. She is still wearing the boot if she has to go outside or walk around. She is also using a 4-point cane. It has been 6 weeks since her injury.     Review of Systems  Constitutional: Negative for fever.  Musculoskeletal: Positive for gait problem. Negative for joint swelling.  Skin: Negative for color change.  Neurological: Negative for weakness and numbness.  Hematological: Negative for adenopathy.    HISTORY: Past Medical, Surgical, Social, and Family History Reviewed & Updated per EMR.   Pertinent Historical Findings include:  Past Medical History:  Diagnosis Date  . Arthritis   . Breast cancer ([place])    left  . Diabetes mellitus without complication ([place])   . GERD (gastroesophageal reflux disease)    occ  . History of radiation therapy 04/15/13-05/12/13   42.5 gray to left breast. lumpectomy cavity to 50 gray  . Hyperlipidemia   . Hypertension   . Hyperthyroidism   . Osteoporosis   . Personal history of radiation therapy 2015    Past Surgical History:  Procedure Laterality Date  . ABDOMINAL HYSTERECTOMY    . BREAST LUMPECTOMY Left 03/04/2013  . BREAST LUMPECTOMY WITH NEEDLE LOCALIZATION Left 03/04/2013   Procedure: LEFT BREAST LUMPECTOMY WITH NEEDLE LOCALIZATION;  Surgeon: Imogene Burn. Georgette Dover, MD;  Location: [place];  Service: General;  Laterality: Left;  . BREAST SURGERY    . EYE SURGERY Bilateral 14  .  SPINE SURGERY  88    Allergies  Allergen Reactions  . Forteo [Teriparatide (Recombinant)]     Stomach problems    Family History  Problem Relation Age of Onset  . Cancer Mother        pt canot remember  . Diabetes Mother   . Stroke Mother   . Heart failure Father   . Lung cancer Brother      Social History   Social History  . Marital status: Single    Spouse name: N/A  . Number of children: 4  . Years of education: N/A   Occupational History  . Not on file.   Social History Main Topics  . Smoking status: Current Every Day Smoker    Packs/day: 0.50    Years: 40.00    Types: Cigarettes  . Smokeless tobacco: Never Used  . Alcohol use No  . Drug use: No  . Sexual activity: Not on file   Other Topics Concern  . Not on file   Social History Narrative   Widowed   Retired Control and instrumentation engineer        PHYSICAL EXAM:  VS: BP (!) 154/96 (BP Location: Left Arm, Patient Position: Sitting, Cuff Size: Normal)   Pulse (!) 103   Temp 97.8 F (36.6 C) (Oral)   Ht 5\' 6"  (1.676 m)   Wt 87 lb (39.5 kg) Comment: Has Una boot on left foot  SpO2  93%   BMI 14.04 kg/m  Physical Exam Gen: NAD, alert, cooperative with exam, well-appearing ENT: normal lips, normal nasal mucosa,  Eye: normal EOM, normal conjunctiva and lids CV:  no edema, +2 pedal pulses   Resp: no accessory muscle use, non-labored,  Skin: no rashes, no areas of induration  Neuro: normal tone, normal sensation to touch Psych:  normal insight, alert and oriented MSK:  Left ankle: Some tenderness to palpation over the distal lateral fibula. No range of motion. Able to and daily without a limp. Normal strength resistance. No overlying swelling or ecchymosis. Neurovascularly intact.  Limited ultrasound: Left ankle:  Callus formation with the anterior cortex of the left lateral fibula. Some callus formation but incomplete healing and the posterior cortex  Summary: Regular healing of the  fracture.  Ultrasound and interpretation by Clearance Coots, MD           ASSESSMENT & PLAN:   Left fibular fracture She is 6 weeks out from her injury. There appears to be normal healing. - She will continue use the boot for the next 2 weeks. - She will follow-up at that time and re-x-ray and ultrasound. Likely she will be transitioned out of the boot at that time. It'll be 8 weeks from her injury. - Provided home exercises

## 2016-12-10 NOTE — Patient Instructions (Signed)
Thank you for coming in,   Please start to wean out of the boot. Please wear the boot if you have to walk a long distance, go up stairs, uneven surfaces or are on a slick surface.   Please follow up with me in two weeks.    Please feel free to call with any questions or concerns at any time, at [phone]. --Dr. Raeford Razor

## 2016-12-17 ENCOUNTER — Encounter: Payer: Self-pay | Admitting: Family Medicine

## 2016-12-17 ENCOUNTER — Ambulatory Visit (INDEPENDENT_AMBULATORY_CARE_PROVIDER_SITE_OTHER)
Admission: RE | Admit: 2016-12-17 | Discharge: 2016-12-17 | Disposition: A | Discharge status: Home / Self Care | Payer: Medicare Other | Source: Ambulatory Visit | Attending: Family Medicine | Admitting: Family Medicine

## 2016-12-17 ENCOUNTER — Ambulatory Visit (INDEPENDENT_AMBULATORY_CARE_PROVIDER_SITE_OTHER): Payer: Medicare Other | Admitting: Family Medicine

## 2016-12-17 VITALS — BP 146/96 | HR 75 | Temp 97.5°F | Ht 66.0 in | Wt 85.1 lb

## 2016-12-17 DIAGNOSIS — S8265XD Nondisplaced fracture of lateral malleolus of left fibula, subsequent encounter for closed fracture with routine healing: Secondary | ICD-10-CM

## 2016-12-17 DIAGNOSIS — S8262XD Displaced fracture of lateral malleolus of left fibula, subsequent encounter for closed fracture with routine healing: Secondary | ICD-10-CM | POA: Diagnosis not present

## 2016-12-17 NOTE — Assessment & Plan Note (Signed)
Has been weaning out of the boot. - X-ray today - Advised to get a gel cushion insole for her foot - We'll call later today with results.

## 2016-12-17 NOTE — Patient Instructions (Signed)
Thank you for coming in,   I will call you with the results from today.    Please feel free to call with any questions or concerns at any time, at [phone]. --Dr. Raeford Razor

## 2016-12-17 NOTE — Progress Notes (Signed)
Rhonda Reynolds - 83 y.o. female MRN [id number]  Date of birth: 1934-08-15  SUBJECTIVE:  Including CC & ROS.  Chief Complaint  Patient presents with  . Follow-up    Patient is here today to F/U with "Closed nondisplaced Fx of lateral Malleolus of left figula."  She states that she is doing good.  She is not wearing her boot today.     Rhonda Reynolds is an 82 year old female is following up for a left fibular fracture. She has some swelling but no pain. She is having some pain on plantar aspect between her first and second digit on her left foot. This is started since she has transitioned out of her boot. She still wears the boot from time to time but is in regular shoes today. She is still using a 4-point cane.     Review of Systems  Musculoskeletal: Positive for gait problem. Negative for joint swelling.  Skin: Negative for color change.    HISTORY: Past Medical, Surgical, Social, and Family History Reviewed & Updated per EMR.   Pertinent Historical Findings include:  Past Medical History:  Diagnosis Date  . Arthritis   . Breast cancer ([place])    left  . Diabetes mellitus without complication ([place])   . GERD (gastroesophageal reflux disease)    occ  . History of radiation therapy 04/15/13-05/12/13   42.5 gray to left breast. lumpectomy cavity to 50 gray  . Hyperlipidemia   . Hypertension   . Hyperthyroidism   . Osteoporosis   . Personal history of radiation therapy 2015    Past Surgical History:  Procedure Laterality Date  . ABDOMINAL HYSTERECTOMY    . BREAST LUMPECTOMY Left 03/04/2013  . BREAST LUMPECTOMY WITH NEEDLE LOCALIZATION Left 03/04/2013   Procedure: LEFT BREAST LUMPECTOMY WITH NEEDLE LOCALIZATION;  Surgeon: Imogene Burn. Georgette Dover, MD;  Location: [facility];  Service: General;  Laterality: Left;  . BREAST SURGERY    . EYE SURGERY Bilateral 14  . SPINE SURGERY  88    Allergies  Allergen Reactions  . Forteo [Teriparatide (Recombinant)]     Stomach problems    Family History   Problem Relation Age of Onset  . Cancer Mother        pt canot remember  . Diabetes Mother   . Stroke Mother   . Heart failure Father   . Lung cancer Brother      Social History   Social History  . Marital status: Single    Spouse name: N/A  . Number of children: 4  . Years of education: N/A   Occupational History  . Not on file.   Social History Main Topics  . Smoking status: Current Every Day Smoker    Packs/day: 0.50    Years: 40.00    Types: Cigarettes  . Smokeless tobacco: Never Used  . Alcohol use No  . Drug use: No  . Sexual activity: Not on file   Other Topics Concern  . Not on file   Social History Narrative   Widowed   Retired Control and instrumentation engineer        PHYSICAL EXAM:  VS: BP (!) 146/96 (BP Location: Left Arm, Patient Position: Sitting, Cuff Size: Normal)   Pulse 75   Temp (!) 97.5 F (36.4 C) (Oral)   Ht 5\' 6"  (1.676 m)   Wt 85 lb 1.3 oz (38.6 kg)   SpO2 97%   BMI 13.73 kg/m  Physical Exam Gen: NAD, alert, cooperative with exam, well-appearing ENT: normal lips,  normal nasal mucosa,  Eye: normal EOM, normal conjunctiva and lids CV:  no edema, +2 pedal pulses   Resp: no accessory muscle use, non-labored,  Skin: no rashes, no areas of induration  Neuro: normal tone, normal sensation to touch Psych:  normal insight, alert and oriented MSK:  Left ankle: No tenderness to palpation of the left distal fibula or lateral malleolus, Normal range of motion, No obvious swelling or ecchymosis, Normal strength resistance, Neurovascularly intact      ASSESSMENT & PLAN:   Left fibular fracture Has been weaning out of the boot. - X-ray today - Advised to get a gel cushion insole for her foot - We'll call later today with results.

## 2016-12-18 ENCOUNTER — Telehealth: Payer: Self-pay | Admitting: Family Medicine

## 2016-12-18 NOTE — Telephone Encounter (Signed)
Spoke with patient about her xray results. She will follow up three weeks .  Rosemarie Ax, MD [facility] 12/18/2016, 9:11 AM

## 2017-01-08 ENCOUNTER — Ambulatory Visit (INDEPENDENT_AMBULATORY_CARE_PROVIDER_SITE_OTHER)
Admission: RE | Admit: 2017-01-08 | Discharge: 2017-01-08 | Disposition: A | Discharge status: Home / Self Care | Payer: Medicare Other | Source: Ambulatory Visit | Attending: Internal Medicine | Admitting: Internal Medicine

## 2017-01-08 DIAGNOSIS — R9389 Abnormal findings on diagnostic imaging of other specified body structures: Secondary | ICD-10-CM

## 2017-01-08 DIAGNOSIS — J439 Emphysema, unspecified: Secondary | ICD-10-CM | POA: Diagnosis not present

## 2017-01-08 DIAGNOSIS — R590 Localized enlarged lymph nodes: Secondary | ICD-10-CM

## 2017-01-09 ENCOUNTER — Ambulatory Visit (INDEPENDENT_AMBULATORY_CARE_PROVIDER_SITE_OTHER): Payer: Medicare Other | Admitting: Family Medicine

## 2017-01-09 ENCOUNTER — Encounter: Payer: Self-pay | Admitting: Family Medicine

## 2017-01-09 ENCOUNTER — Encounter: Payer: Self-pay | Admitting: Internal Medicine

## 2017-01-09 VITALS — BP 138/92 | HR 58 | Temp 97.9°F | Wt 85.0 lb

## 2017-01-09 DIAGNOSIS — S8265XD Nondisplaced fracture of lateral malleolus of left fibula, subsequent encounter for closed fracture with routine healing: Secondary | ICD-10-CM

## 2017-01-09 DIAGNOSIS — Z23 Encounter for immunization: Secondary | ICD-10-CM | POA: Diagnosis not present

## 2017-01-09 DIAGNOSIS — K861 Other chronic pancreatitis: Secondary | ICD-10-CM | POA: Insufficient documentation

## 2017-01-09 NOTE — Assessment & Plan Note (Signed)
Still in the healing process. Not currently having pain. Has weaned out of the boot - can try an ASO brace if she is doing any excessive  - can follow up in 4-6 weeks.  - tylenol as needed for pain.  - continue exercises  - could consider PT if any set back or re-image.

## 2017-01-09 NOTE — Patient Instructions (Signed)
Thank you for coming in,   Please follow up with me 4-6 weeks.   Please try an ankle brace if you have problems with walking or pain.    Please feel free to call with any questions or concerns at any time, at [phone]. --Dr. Raeford Razor

## 2017-01-09 NOTE — Progress Notes (Signed)
MC HOLLEN - 82 y.o. female MRN [id number]  Date of birth: 03/19/1935  SUBJECTIVE:  Including CC & ROS.  No chief complaint on file.   Rhonda Reynolds is an 82 year old female that is following up for her left lateral fibular fracture. She is no longer walking with a cane and has transitioned out of the cam walker. She denies any pain with ambulation. Has not been taking any pain medications. She feels that she is almost back to her normal self.  She is roughly 10 weeks out from her initial injury.   Independent review of her x-ray from 8/14 shows a distal left fibular fracture. Independent review of the x-ray from 8/6 shows a fracture of the distal fibula  Review of Systems  Musculoskeletal: Negative for gait problem and joint swelling.  Skin: Negative for color change.  Neurological: Negative for weakness and numbness.    HISTORY: Past Medical, Surgical, Social, and Family History Reviewed & Updated per EMR.   Pertinent Historical Findings include:  Past Medical History:  Diagnosis Date  . Arthritis   . Breast cancer ([place])    left  . Diabetes mellitus without complication ([place])   . GERD (gastroesophageal reflux disease)    occ  . History of radiation therapy 04/15/13-05/12/13   42.5 gray to left breast. lumpectomy cavity to 50 gray  . Hyperlipidemia   . Hypertension   . Hyperthyroidism   . Osteoporosis   . Personal history of radiation therapy 2015    Past Surgical History:  Procedure Laterality Date  . ABDOMINAL HYSTERECTOMY    . BREAST LUMPECTOMY Left 03/04/2013  . BREAST LUMPECTOMY WITH NEEDLE LOCALIZATION Left 03/04/2013   Procedure: LEFT BREAST LUMPECTOMY WITH NEEDLE LOCALIZATION;  Surgeon: Imogene Burn. Georgette Dover, MD;  Location: [place];  Service: General;  Laterality: Left;  . BREAST SURGERY    . EYE SURGERY Bilateral 14  . SPINE SURGERY  88    Allergies  Allergen Reactions  . Forteo [Teriparatide (Recombinant)]     Stomach problems    Family History  Problem  Relation Age of Onset  . Cancer Mother        pt canot remember  . Diabetes Mother   . Stroke Mother   . Heart failure Father   . Lung cancer Brother      Social History   Social History  . Marital status: Single    Spouse name: N/A  . Number of children: 4  . Years of education: N/A   Occupational History  . Not on file.   Social History Main Topics  . Smoking status: Current Every Day Smoker    Packs/day: 0.50    Years: 40.00    Types: Cigarettes  . Smokeless tobacco: Never Used  . Alcohol use No  . Drug use: No  . Sexual activity: Not on file   Other Topics Concern  . Not on file   Social History Narrative   Widowed   Retired Control and instrumentation engineer        PHYSICAL EXAM:  VS: BP (!) 138/92   Pulse (!) 58   Temp 97.9 F (36.6 C)   Wt 85 lb (38.6 kg)   SpO2 90%   BMI 13.72 kg/m  Physical Exam Gen: NAD, alert, cooperative with exam, well-appearing ENT: normal lips, normal nasal mucosa,  Eye: normal EOM, normal conjunctiva and lids CV:  no edema, +2 pedal pulses   Resp: no accessory muscle use, non-labored,  GI: no masses or tenderness, no hernia  Skin: no rashes, no areas of induration  Neuro: normal tone, normal sensation to touch Psych:  normal insight, alert and oriented MSK:  Left ankle: No tenderness to palpation of the lateral fibula. Normal ankle range of motion. Normal strength to resistance. Normal gait. Neurovascular intact  Limited ultrasound: Left ankle:  Healed anterior cortex. Lateral aspect still in the healing process with callus formation. Posterior cortex still slightly open.  Summary: Healing lateral fibular fracture  Ultrasound and interpretation by Clearance Coots, MD             ASSESSMENT & PLAN:   Left fibular fracture Still in the healing process. Not currently having pain. Has weaned out of the boot - can try an ASO brace if she is doing any excessive  - can follow up in 4-6 weeks.  - tylenol as needed  for pain.  - continue exercises  - could consider PT if any set back or re-image.

## 2017-01-17 ENCOUNTER — Telehealth: Payer: Self-pay | Admitting: Family Medicine

## 2017-01-17 DIAGNOSIS — S82402A Unspecified fracture of shaft of left fibula, initial encounter for closed fracture: Secondary | ICD-10-CM | POA: Diagnosis not present

## 2017-01-17 NOTE — Telephone Encounter (Signed)
Called patient she requested to get ASO brace and form filled out for her insurance.Patient will pick it up.

## 2017-01-17 NOTE — Telephone Encounter (Signed)
Patient stated her insurance will cover the brace she needs. You would just need to do your part to make that happen. She did not have any more information as far what that would be. She states the insurance informed her you would know. Please advise. Thank you.

## 2017-01-18 ENCOUNTER — Encounter: Payer: Self-pay | Admitting: Family Medicine

## 2017-01-18 ENCOUNTER — Ambulatory Visit (INDEPENDENT_AMBULATORY_CARE_PROVIDER_SITE_OTHER): Payer: Medicare Other | Admitting: Family Medicine

## 2017-01-18 DIAGNOSIS — S82402A Unspecified fracture of shaft of left fibula, initial encounter for closed fracture: Secondary | ICD-10-CM | POA: Diagnosis not present

## 2017-01-18 DIAGNOSIS — S8265XD Nondisplaced fracture of lateral malleolus of left fibula, subsequent encounter for closed fracture with routine healing: Secondary | ICD-10-CM | POA: Diagnosis not present

## 2017-01-18 NOTE — Assessment & Plan Note (Signed)
She was placed in a Aircast splint today. - Follow-up in 4 weeks

## 2017-01-18 NOTE — Patient Instructions (Signed)
Thank you for coming in,   Please follow up with me in 4 weeks or so.    Please feel free to call with any questions or concerns at any time, at [phone]. --Dr. Raeford Razor

## 2017-01-18 NOTE — Progress Notes (Signed)
Rhonda Reynolds - 82 y.o. female MRN [id number]  Date of birth: 1934/04/28  SUBJECTIVE:  Including CC & ROS.  Chief Complaint  Patient presents with  . Ankle wrap    needs help with ankle wrap     Rhonda Reynolds is a 82 y.o. female that is  following up for her left fracture. She was provided an ASO brace but is unable to wear. She is having some lateral pain and some forefoot family.   Review of Systems  Musculoskeletal: Negative for gait problem.  Skin: Negative for color change.    HISTORY: Past Medical, Surgical, Social, and Family History Reviewed & Updated per EMR.   Pertinent Historical Findings include:  Past Medical History:  Diagnosis Date  . Arthritis   . Breast cancer ([place])    left  . Diabetes mellitus without complication ([place])   . GERD (gastroesophageal reflux disease)    occ  . History of radiation therapy 04/15/13-05/12/13   42.5 gray to left breast. lumpectomy cavity to 50 gray  . Hyperlipidemia   . Hypertension   . Hyperthyroidism   . Osteoporosis   . Personal history of radiation therapy 2015    Past Surgical History:  Procedure Laterality Date  . ABDOMINAL HYSTERECTOMY    . BREAST LUMPECTOMY Left 03/04/2013  . BREAST LUMPECTOMY WITH NEEDLE LOCALIZATION Left 03/04/2013   Procedure: LEFT BREAST LUMPECTOMY WITH NEEDLE LOCALIZATION;  Surgeon: Imogene Burn. Georgette Dover, MD;  Location: [place];  Service: General;  Laterality: Left;  . BREAST SURGERY    . EYE SURGERY Bilateral 14  . SPINE SURGERY  88    Allergies  Allergen Reactions  . Forteo [Teriparatide (Recombinant)]     Stomach problems    Family History  Problem Relation Age of Onset  . Cancer Mother        pt canot remember  . Diabetes Mother   . Stroke Mother   . Heart failure Father   . Lung cancer Brother      Social History   Social History  . Marital status: Single    Spouse name: N/A  . Number of children: 4  . Years of education: N/A   Occupational History  . Not on file.   Social  History Main Topics  . Smoking status: Current Every Day Smoker    Packs/day: 0.50    Years: 40.00    Types: Cigarettes  . Smokeless tobacco: Never Used  . Alcohol use No  . Drug use: No  . Sexual activity: Not on file   Other Topics Concern  . Not on file   Social History Narrative   Widowed   Retired Control and instrumentation engineer        PHYSICAL EXAM:  VS: BP 112/78 (BP Location: Left Arm, Patient Position: Sitting, Cuff Size: Normal)   Pulse 86   Temp 98.5 F (36.9 C) (Oral)   Ht 5\' 6"  (1.676 m)   Wt 86 lb (39 kg)   SpO2 98%   BMI 13.88 kg/m  Physical Exam Gen: NAD, alert, cooperative with exam, well-appearing ENT: normal lips, normal nasal mucosa,  Eye: normal EOM, normal conjunctiva and lids CV:  no edema, +2 pedal pulses   Resp: no accessory muscle use, non-labored,  Skin: no rashes, no areas of induration  Neuro: normal tone, normal sensation to touch Psych:  normal insight, alert and oriented MSK:  Left ankle: No abnormal swelling. Normal range of motion      ASSESSMENT & PLAN:   Left  fibular fracture She was placed in a Aircast splint today. - Follow-up in 4 weeks

## 2017-04-22 ENCOUNTER — Other Ambulatory Visit: Payer: Self-pay | Admitting: Internal Medicine

## 2017-04-22 DIAGNOSIS — Z853 Personal history of malignant neoplasm of breast: Secondary | ICD-10-CM

## 2017-04-23 ENCOUNTER — Encounter: Payer: Self-pay | Admitting: Family Medicine

## 2017-04-23 ENCOUNTER — Ambulatory Visit (INDEPENDENT_AMBULATORY_CARE_PROVIDER_SITE_OTHER): Payer: Medicare Other | Admitting: Family Medicine

## 2017-04-23 DIAGNOSIS — S8265XD Nondisplaced fracture of lateral malleolus of left fibula, subsequent encounter for closed fracture with routine healing: Secondary | ICD-10-CM | POA: Diagnosis not present

## 2017-04-23 DIAGNOSIS — H26493 Other secondary cataract, bilateral: Secondary | ICD-10-CM | POA: Diagnosis not present

## 2017-04-23 DIAGNOSIS — H40023 Open angle with borderline findings, high risk, bilateral: Secondary | ICD-10-CM | POA: Diagnosis not present

## 2017-04-23 LAB — HM DIABETES EYE EXAM

## 2017-04-23 NOTE — Assessment & Plan Note (Signed)
Appears to be healed on [place].  - can start getting back to her regular exercise  - can use a compression sleeve  - f/u PRN.

## 2017-04-23 NOTE — Patient Instructions (Signed)
Please try to continue performing some of your range of motion exercises  You can wear compression on your ankle if you bothers you at all.  You can start walking and exercising

## 2017-04-23 NOTE — Progress Notes (Signed)
Rhonda Reynolds - 83 y.o. female MRN [id number]  Date of birth: 06-11-1934  SUBJECTIVE:  Including CC & ROS.  Chief Complaint  Patient presents with  . Follow-up    Rhonda Reynolds is a 83 y.o. female that is here today for follow up left ankle fracture.She states her ankle has improved. She has been able to walk with no pain. She denies any significant pain. Has not been wearing a brace. Denies any swelling and bruising. Has been walking regularly.   Independent review of the ankle xray on 9/24 shows a healing fracture.    Review of Systems  Constitutional: Negative for fever.  HENT: Negative for ear pain.   Respiratory: Negative for cough.   Cardiovascular: Negative for chest pain.  Gastrointestinal: Negative for abdominal pain.  Musculoskeletal: Negative for gait problem.  Skin: Negative for color change.  Neurological: Negative for weakness.  Hematological: Negative for adenopathy.  Psychiatric/Behavioral: Negative for agitation.    HISTORY: Past Medical, Surgical, Social, and Family History Reviewed & Updated per EMR.   Pertinent Historical Findings include:  Past Medical History:  Diagnosis Date  . Arthritis   . Breast cancer ([place])    left  . Diabetes mellitus without complication ([place])   . GERD (gastroesophageal reflux disease)    occ  . History of radiation therapy 04/15/13-05/12/13   42.5 gray to left breast. lumpectomy cavity to 50 gray  . Hyperlipidemia   . Hypertension   . Hyperthyroidism   . Osteoporosis   . Personal history of radiation therapy 2015    Past Surgical History:  Procedure Laterality Date  . ABDOMINAL HYSTERECTOMY    . BREAST LUMPECTOMY Left 03/04/2013  . BREAST LUMPECTOMY WITH NEEDLE LOCALIZATION Left 03/04/2013   Procedure: LEFT BREAST LUMPECTOMY WITH NEEDLE LOCALIZATION;  Surgeon: Imogene Burn. Georgette Dover, MD;  Location: [facility];  Service: General;  Laterality: Left;  . BREAST SURGERY    . EYE SURGERY Bilateral 16  . SPINE SURGERY  89     Allergies  Allergen Reactions  . Forteo [Teriparatide (Recombinant)]     Stomach problems    Family History  Problem Relation Age of Onset  . Cancer Mother        pt canot remember  . Diabetes Mother   . Stroke Mother   . Heart failure Father   . Lung cancer Brother      Social History   Socioeconomic History  . Marital status: Single    Spouse name: Not on file  . Number of children: 4  . Years of education: Not on file  . Highest education level: Not on file  Social Needs  . Financial resource strain: Not on file  . Food insecurity - worry: Not on file  . Food insecurity - inability: Not on file  . Transportation needs - medical: Not on file  . Transportation needs - non-medical: Not on file  Occupational History  . Not on file  Tobacco Use  . Smoking status: Current Every Day Smoker    Packs/day: 0.50    Years: 40.00    Pack years: 20.00    Types: Cigarettes  . Smokeless tobacco: Never Used  Substance and Sexual Activity  . Alcohol use: No  . Drug use: No  . Sexual activity: Not on file  Other Topics Concern  . Not on file  Social History Narrative   Widowed   Retired Control and instrumentation engineer     PHYSICAL EXAM:  VS: BP 102/66 (BP Location: Left  Arm, Patient Position: Sitting, Cuff Size: Normal)   Pulse 64   Temp 97.6 F (36.4 C) (Oral)   Ht 5\' 6"  (1.676 m)   Wt 86 lb (39 kg)   SpO2 97%   BMI 13.88 kg/m  Physical Exam Gen: NAD, alert, cooperative with exam, well-appearing ENT: normal lips, normal nasal mucosa,  Eye: normal EOM, normal conjunctiva and lids CV:  no edema, +2 pedal pulses   Resp: no accessory muscle use, non-labored,  Skin: no rashes, no areas of induration  Neuro: normal tone, normal sensation to touch Psych:  normal insight, alert and oriented MSK:  Left ankle:  No swelling  Normal ROM  No ttp of the lateral malleolus.  Normal strength to resistance.  Normal gait  Neurovascularly intact   Limited ultrasound: left  ankle:  Callus formation to demonstrate a healed fracture   Summary: healed fracture   Ultrasound and interpretation by Clearance Coots, MD        ASSESSMENT & PLAN:   Left fibular fracture Appears to be healed on [place].  - can start getting back to her regular exercise  - can use a compression sleeve  - f/u PRN.

## 2017-04-25 ENCOUNTER — Encounter: Payer: Self-pay | Admitting: Internal Medicine

## 2017-04-26 DIAGNOSIS — R221 Localized swelling, mass and lump, neck: Secondary | ICD-10-CM | POA: Diagnosis not present

## 2017-04-26 DIAGNOSIS — H903 Sensorineural hearing loss, bilateral: Secondary | ICD-10-CM | POA: Diagnosis not present

## 2017-05-13 ENCOUNTER — Ambulatory Visit (INDEPENDENT_AMBULATORY_CARE_PROVIDER_SITE_OTHER): Payer: Medicare Other | Admitting: *Deleted

## 2017-05-13 VITALS — BP 127/78 | HR 93 | Resp 18 | Ht 66.0 in | Wt 88.0 lb

## 2017-05-13 DIAGNOSIS — Z0000 Encounter for general adult medical examination without abnormal findings: Secondary | ICD-10-CM | POA: Diagnosis not present

## 2017-05-13 DIAGNOSIS — E119 Type 2 diabetes mellitus without complications: Secondary | ICD-10-CM

## 2017-05-13 NOTE — Progress Notes (Addendum)
Subjective:   Rhonda Reynolds is a 83 y.o. female who presents for an Initial Medicare Annual Wellness Visit.  Review of Systems   No ROS.  Medicare Wellness Visit. Additional risk factors are reflected in the social history.   Cardiac Risk Factors include: advanced age (>50men, >41 women);diabetes mellitus;dyslipidemia;hypertension Sleep patterns: has interrupted sleep, gets up 1-2 times nightly to void and sleeps 5-6 hours nightly.  Patient reports insomnia issues, discussed recommended sleep tips.  Home Safety/Smoke Alarms: Feels safe in home. Smoke alarms in place.  Living environment; residence and Firearm Safety: 1-story house/ trailer, no firearms. Lives alone, no needs for DME, limited support system Seat Belt Safety/Bike Helmet: Wears seat belt.    Objective:    Today's Vitals   05/13/17 1333  BP: 127/78  Pulse: 93  Resp: 18  SpO2: 99%  Weight: 88 lb (39.9 kg)  Height: 5\' 6"  (1.676 m)   Body mass index is 14.2 kg/m.  Advanced Directives 05/13/2017 03/02/2013  Does Patient Have a Medical Advance Directive? Yes Patient has advance directive, copy not in chart  Type of Advance Directive [place];Living will Living will;Healthcare Power of [place] in Chart? No - copy requested -    Current Medications (verified) Outpatient Encounter Medications as of 05/13/2017  Medication Sig  . alendronate (FOSAMAX) 70 MG tablet Take 1 tablet (70 mg total) by mouth every 7 (seven) days. Take with a full glass of water on an empty stomach.  Marland Kitchen aspirin EC 81 MG tablet Take 81 mg by mouth daily.  . carboxymethylcellulose (REFRESH PLUS) 0.5 % SOLN 1 drop 3 (three) times daily as needed (DRY EYE).   . cholecalciferol (VITAMIN D) 1000 units tablet Take 3 tablets (3,000 Units total) by mouth daily.  . cycloSPORINE (RESTASIS) 0.05 % ophthalmic emulsion Place 1 drop into both eyes 2 (two) times daily.  Marland Kitchen guaiFENesin (MUCINEX) 600 MG  12 hr tablet Take by mouth.  . tiotropium (SPIRIVA HANDIHALER) 18 MCG inhalation capsule    No facility-administered encounter medications on file as of 05/13/2017.     Allergies (verified) Forteo [teriparatide (recombinant)]   History: Past Medical History:  Diagnosis Date  . Arthritis   . Breast cancer ([place])    left  . Diabetes mellitus without complication ([place])   . GERD (gastroesophageal reflux disease)    occ  . History of radiation therapy 04/15/13-05/12/13   42.5 gray to left breast. lumpectomy cavity to 50 gray  . Hyperlipidemia   . Hypertension   . Hyperthyroidism   . Osteoporosis   . Personal history of radiation therapy 2015   Past Surgical History:  Procedure Laterality Date  . ABDOMINAL HYSTERECTOMY    . BREAST LUMPECTOMY Left 03/04/2013  . BREAST LUMPECTOMY WITH NEEDLE LOCALIZATION Left 03/04/2013   Procedure: LEFT BREAST LUMPECTOMY WITH NEEDLE LOCALIZATION;  Surgeon: Imogene Burn. Georgette Dover, MD;  Location: [facility];  Service: General;  Laterality: Left;  . BREAST SURGERY    . EYE SURGERY Bilateral 16  . SPINE SURGERY  63   Family History  Problem Relation Age of Onset  . Cancer Mother        pt canot remember  . Diabetes Mother   . Stroke Mother   . Heart failure Father   . Lung cancer Brother    Social History   Socioeconomic History  . Marital status: Single    Spouse name: None  . Number of children: 4  .  Years of education: None  . Highest education level: None  Social Needs  . Financial resource strain: Not very hard  . Food insecurity - worry: Sometimes true  . Food insecurity - inability: Sometimes true  . Transportation needs - medical: No  . Transportation needs - non-medical: No  Occupational History  . None  Tobacco Use  . Smoking status: Current Every Day Smoker    Packs/day: 0.50    Years: 40.00    Pack years: 20.00    Types: Cigarettes  . Smokeless tobacco: Never Used  Substance and Sexual Activity  . Alcohol use: No  . Drug use:  No  . Sexual activity: No  Other Topics Concern  . None  Social History Narrative   Widowed   Retired Control and instrumentation engineer    Tobacco Counseling Ready to quit: Not Answered Counseling given: Not Answered  Activities of Daily Living In your present state of health, do you have any difficulty performing the following activities: 05/13/2017  Hearing? N  Vision? N  Difficulty concentrating or making decisions? N  Walking or climbing stairs? N  Dressing or bathing? N  Doing errands, shopping? N  Preparing Food and eating ? N  Using the Toilet? N  In the past six months, have you accidently leaked urine? N  Do you have problems with loss of bowel control? N  Managing your Medications? N  Managing your Finances? N  Housekeeping or managing your Housekeeping? N  Some recent data might be hidden     Immunizations and Health Maintenance Immunization History  Administered Date(s) Administered  . Influenza, High Dose Seasonal PF 12/19/2015, 01/09/2017  . Pneumococcal Conjugate-13 02/02/2014  . Pneumococcal Polysaccharide-23 12/10/2001  . Tdap 04/20/2013   Health Maintenance Due  Topic Date Due  . FOOT EXAM  12/11/1946  . URINE MICROALBUMIN  12/18/2016    Patient Care Team: Binnie Rail, MD as PCP - General (Internal Medicine)  Indicate any recent Medical Services you may have received from other than [facility] providers in the past year (date may be approximate).     Assessment:   This is a routine wellness examination for Rhonda Reynolds. Physical assessment deferred to PCP.   Hearing/Vision screen No exam data present  Dietary issues and exercise activities discussed: Current Exercise Habits: The patient does not participate in regular exercise at present Diet (meal preparation, eat out, water intake, caffeinated beverages, dairy products, fruits and vegetables): in general, a "healthy" diet     Reviewed heart healthy and diabetic diet, encouraged patient to increase daily  water intake.   Goals    . Patient Stated     Stay as healthy and as independent as possible by monitoring my diet for sugar and carbohydrates, increasing my physical activity by looking into the [facility], and drink more water.       Depression Screen PHQ 2/9 Scores 05/13/2017 01/18/2017 12/19/2015  PHQ - 2 Score 0 0 0  PHQ- 9 Score 3 - -    Fall Risk Fall Risk  05/13/2017 01/18/2017 12/19/2015  Falls in the past year? Yes Yes No  Number falls in past yr: 1 1 -  Injury with Fall? - Yes -  Follow up Falls prevention discussed - -  Cognitive Function: MMSE - Mini Mental State Exam 05/13/2017  Orientation to time 5  Orientation to Place 5  Registration 3  Attention/ Calculation 5  Recall 1  Language- name 2 objects 2  Language- repeat 1  Language- follow 3  step command 3  Language- read & follow direction 1  Write a sentence 1  Copy design 1  Total score 28        Screening Tests Health Maintenance  Topic Date Due  . FOOT EXAM  12/11/1946  . URINE MICROALBUMIN  12/18/2016  . MAMMOGRAM  05/29/2017  . OPHTHALMOLOGY EXAM  04/23/2018  . TETANUS/TDAP  04/21/2023  . INFLUENZA VACCINE  Completed  . DEXA SCAN  Completed  . PNA vac Low Risk Adult  Completed     Plan:     [facility] referral placed for diabetes education and community resources.   Patient declined referral to bone density today, she states she will call nurse and schedule bone density at a later date.   Continue doing brain stimulating activities (puzzles, reading, adult coloring books, staying active) to keep memory sharp.   Continue to eat heart healthy diet (full of fruits, vegetables, whole grains, lean protein, water--limit salt, fat, and sugar intake) and increase physical activity as tolerated.  I have personally reviewed and noted the following in the patient's chart:   . Medical and social history . Use of alcohol, tobacco or illicit drugs  . Current medications and supplements . Functional  ability and status . Nutritional status . Physical activity . Advanced directives . List of other physicians . Vitals . Screenings to include cognitive, depression, and falls . Referrals and appointments  In addition, I have reviewed and discussed with patient certain preventive protocols, quality metrics, and best practice recommendations. A written personalized care plan for preventive services as well as general preventive health recommendations were provided to patient.     Michiel Cowboy, RN   05/13/2017    Medical screening examination/treatment/procedure(s) were performed by non-physician practitioner and as supervising physician I was immediately available for consultation/collaboration. I agree with above. Binnie Rail, MD

## 2017-05-13 NOTE — Patient Instructions (Addendum)
Continue doing brain stimulating activities (puzzles, reading, adult coloring books, staying active) to keep memory sharp.   Continue to eat heart healthy diet (full of fruits, vegetables, whole grains, lean protein, water--limit salt, fat, and sugar intake) and increase physical activity as tolerated.   Rhonda Rhonda , Thank you for taking time to come for your Medicare Wellness Visit. I appreciate your ongoing commitment to your health goals. Please review the following plan we discussed and let me know if I can assist you in the future.   These are the goals we discussed: Goals    . Patient Stated     Stay as healthy and as independent as possible by monitoring my diet for sugar and carbohydrates, increasing my physical activity by looking into the [facility], and drink more water.        This is a list of the screening recommended for you and due dates:  Health Maintenance  Topic Date Due  . Complete foot exam   12/11/1944  . Urine Protein Check  12/18/2016  . Mammogram  05/29/2017  . Eye exam for diabetics  04/23/2018  . Tetanus Vaccine  04/21/2023  . Flu Shot  Completed  . DEXA scan (bone density measurement)  Completed  . Pneumonia vaccines  Completed      Carbohydrate Counting for Diabetes Mellitus, Adult Carbohydrate counting is a method for keeping track of how many carbohydrates you eat. Eating carbohydrates naturally increases the amount of sugar (glucose) in the blood. Counting how many carbohydrates you eat helps keep your blood glucose within normal limits, which helps you manage your diabetes (diabetes mellitus). It is important to know how many carbohydrates you can safely have in each meal. This is different for every person. A diet and nutrition specialist (registered dietitian) can help you make a meal plan and calculate how many carbohydrates you should have at each meal and snack. Carbohydrates are found in the following foods:  Grains, such as breads and  cereals.  Dried beans and soy products.  Starchy vegetables, such as potatoes, peas, and corn.  Fruit and fruit juices.  Milk and yogurt.  Sweets and snack foods, such as cake, cookies, candy, chips, and soft drinks.  How do I count carbohydrates? There are two ways to count carbohydrates in food. You can use either of the methods or a combination of both. Reading "Nutrition Facts" on packaged food The "Nutrition Facts" list is included on the labels of almost all packaged foods and beverages in the [place] It includes:  The serving size.  Information about nutrients in each serving, including the grams (g) of carbohydrate per serving.  To use the "Nutrition Facts":  Decide how many servings you will have.  Multiply the number of servings by the number of carbohydrates per serving.  The resulting number is the total amount of carbohydrates that you will be having.  Learning standard serving sizes of other foods When you eat foods containing carbohydrates that are not packaged or do not include "Nutrition Facts" on the label, you need to measure the servings in order to count the amount of carbohydrates:  Measure the foods that you will eat with a food scale or measuring cup, if needed.  Decide how many standard-size servings you will eat.  Multiply the number of servings by 15. Most carbohydrate-rich foods have about 15 g of carbohydrates per serving. ? For example, if you eat 8 oz (170 g) of strawberries, you will have eaten 2 servings and  30 g of carbohydrates (2 servings x 15 g = 30 g).  For foods that have more than one food mixed, such as soups and casseroles, you must count the carbohydrates in each food that is included.  The following list contains standard serving sizes of common carbohydrate-rich foods. Each of these servings has about 15 g of carbohydrates:   hamburger bun or  English muffin.   oz (15 mL) syrup.   oz (14 g) jelly.  1 slice of bread.  1  six-inch tortilla.  3 oz (85 g) cooked rice or pasta.  4 oz (113 g) cooked dried beans.  4 oz (113 g) starchy vegetable, such as peas, corn, or potatoes.  4 oz (113 g) hot cereal.  4 oz (113 g) mashed potatoes or  of a large baked potato.  4 oz (113 g) canned or frozen fruit.  4 oz (120 mL) fruit juice.  4-6 crackers.  6 chicken nuggets.  6 oz (170 g) unsweetened dry cereal.  6 oz (170 g) plain fat-free yogurt or yogurt sweetened with artificial sweeteners.  8 oz (240 mL) milk.  8 oz (170 g) fresh fruit or one small piece of fruit.  24 oz (680 g) popped popcorn.  Example of carbohydrate counting Sample meal  3 oz (85 g) chicken breast.  6 oz (170 g) brown rice.  4 oz (113 g) corn.  8 oz (240 mL) milk.  8 oz (170 g) strawberries with sugar-free whipped topping. Carbohydrate calculation 1. Identify the foods that contain carbohydrates: ? Rice. ? Corn. ? Milk. ? Strawberries. 2. Calculate how many servings you have of each food: ? 2 servings rice. ? 1 serving corn. ? 1 serving milk. ? 1 serving strawberries. 3. Multiply each number of servings by 15 g: ? 2 servings rice x 15 g = 30 g. ? 1 serving corn x 15 g = 15 g. ? 1 serving milk x 15 g = 15 g. ? 1 serving strawberries x 15 g = 15 g. 4. Add together all of the amounts to find the total grams of carbohydrates eaten: ? 30 g + 15 g + 15 g + 15 g = 75 g of carbohydrates total. This information is not intended to replace advice given to you by your health care provider. Make sure you discuss any questions you have with your health care provider. Document Released: 03/12/2005 Document Revised: 09/30/2015 Document Reviewed: 08/24/2015 Elsevier Interactive Patient Education  2018 Rhonda Rhonda. Insomnia Insomnia is a sleep disorder that makes it difficult to fall asleep or to stay asleep. Insomnia can cause tiredness (fatigue), low energy, difficulty concentrating, mood swings, and poor performance at work or  school. There are three different ways to classify insomnia:  Difficulty falling asleep.  Difficulty staying asleep.  Waking up too early in the morning.  Any type of insomnia can be long-term (chronic) or short-term (acute). Both are common. Short-term insomnia usually lasts for three months or less. Chronic insomnia occurs at least three times a week for longer than three months. What are the causes? Insomnia may be caused by another condition, situation, or substance, such as:  Anxiety.  Certain medicines.  Gastroesophageal reflux disease (GERD) or other gastrointestinal conditions.  Asthma or other breathing conditions.  Restless legs syndrome, sleep apnea, or other sleep disorders.  Chronic pain.  Menopause. This may include hot flashes.  Stroke.  Abuse of alcohol, tobacco, or illegal drugs.  Depression.  Caffeine.  Neurological disorders, such as Alzheimer  disease.  An overactive thyroid (hyperthyroidism).  The cause of insomnia may not be known. What increases the risk? Risk factors for insomnia include:  Gender. Women are more commonly affected than men.  Age. Insomnia is more common as you get older.  Stress. This may involve your professional or personal life.  Income. Insomnia is more common in people with lower income.  Lack of exercise.  Irregular work schedule or night shifts.  Traveling between different time zones.  What are the signs or symptoms? If you have insomnia, trouble falling asleep or trouble staying asleep is the main symptom. This may lead to other symptoms, such as:  Feeling fatigued.  Feeling nervous about going to sleep.  Not feeling rested in the morning.  Having trouble concentrating.  Feeling irritable, anxious, or depressed.  How is this treated? Treatment for insomnia depends on the cause. If your insomnia is caused by an underlying condition, treatment will focus on addressing the condition. Treatment may also  include:  Medicines to help you sleep.  Counseling or therapy.  Lifestyle adjustments.  Follow these instructions at home:  Take medicines only as directed by your health care provider.  Keep regular sleeping and waking hours. Avoid naps.  Keep a sleep diary to help you and your health care provider figure out what could be causing your insomnia. Include: ? When you sleep. ? When you wake up during the night. ? How well you sleep. ? How rested you feel the next day. ? Any side effects of medicines you are taking. ? What you eat and drink.  Make your bedroom a comfortable place where it is easy to fall asleep: ? Put up shades or special blackout curtains to block light from outside. ? Use a white noise machine to block noise. ? Keep the temperature cool.  Exercise regularly as directed by your health care provider. Avoid exercising right before bedtime.  Use relaxation techniques to manage stress. Ask your health care provider to suggest some techniques that may work well for you. These may include: ? Breathing exercises. ? Routines to release muscle tension. ? Visualizing peaceful scenes.  Cut back on alcohol, caffeinated beverages, and cigarettes, especially close to bedtime. These can disrupt your sleep.  Do not overeat or eat spicy foods right before bedtime. This can lead to digestive discomfort that can make it hard for you to sleep.  Limit screen use before bedtime. This includes: ? Watching TV. ? Using your smartphone, tablet, and computer.  Stick to a routine. This can help you fall asleep faster. Try to do a quiet activity, brush your teeth, and go to bed at the same time each night.  Get out of bed if you are still awake after 15 minutes of trying to sleep. Keep the lights down, but try reading or doing a quiet activity. When you feel sleepy, go back to bed.  Make sure that you drive carefully. Avoid driving if you feel very sleepy.  Keep all follow-up  appointments as directed by your health care provider. This is important. Contact a health care provider if:  You are tired throughout the day or have trouble in your daily routine due to sleepiness.  You continue to have sleep problems or your sleep problems get worse. Get help right away if:  You have serious thoughts about hurting yourself or someone else. This information is not intended to replace advice given to you by your health care provider. Make sure you discuss any questions  you have with your health care provider. Document Released: 03/09/2000 Document Revised: 08/12/2015 Document Reviewed: 12/11/2013 Elsevier Interactive Patient Education  [employer]. It is important to avoid accidents which may result in broken bones.  Here are a few ideas on how to make your home safer so you will be less likely to trip or fall.  1. Use nonskid mats or non slip strips in your shower or tub, on your bathroom floor and around sinks.  If you know that you have spilled water, wipe it up! 2. In the bathroom, it is important to have properly installed grab bars on the walls or on the edge of the tub.  Towel racks are NOT strong enough for you to hold onto or to pull on for support. 3. Stairs and hallways should have enough light.  Add lamps or night lights if you need ore light. 4. It is good to have handrails on both sides of the stairs if possible.  Always fix broken handrails right away. 5. It is important to see the edges of steps.  Paint the edges of outdoor steps white so you can see them better.  Put colored tape on the edge of inside steps. 6. Throw-rugs are dangerous because they can slide.  Removing the rugs is the best idea, but if they must stay, add adhesive carpet tape to prevent slipping. 7. Do not keep things on stairs or in the halls.  Remove small furniture that blocks the halls as it may cause you to trip.  Keep telephone and electrical cords out of the way where you  walk. 8. Always were sturdy, rubber-soled shoes for good support.  Never wear just socks, especially on the stairs.  Socks may cause you to slip or fall.  Do not wear full-length housecoats as you can easily trip on the bottom.  9. Place the things you use the most on the shelves that are the easiest to reach.  If you use a stepstool, make sure it is in good condition.  If you feel unsteady, DO NOT climb, ask for help. 10. If a health professional advises you to use a cane or walker, do not be ashamed.  These items can keep you from falling and breaking your bones.

## 2017-05-15 ENCOUNTER — Other Ambulatory Visit: Payer: Self-pay | Admitting: *Deleted

## 2017-05-15 NOTE — Patient Outreach (Signed)
[facility]) Care Management  05/15/2017  Rhonda Reynolds 1934-12-09 [id number]    Referral received 2/19  RN attempted initial outreach unsuccessful however able to leave a HIPAA approved voice message requesting a call back. Will attempt another outreach call on tomorrow for pending [facility] services.  Raina Mina, RN Care Management Coordinator [place] Office [phone]

## 2017-05-16 ENCOUNTER — Encounter: Payer: Self-pay | Admitting: *Deleted

## 2017-05-16 ENCOUNTER — Other Ambulatory Visit: Payer: Self-pay | Admitting: *Deleted

## 2017-05-16 NOTE — Patient Outreach (Signed)
[facility]) Care Management  05/16/2017  Rhonda Reynolds [birth year] [id number]    Telephone Assessment (2nd attempt)  RN attempt to reach pt today however unsuccessful and only able to leave a HIPAA approved voice message requesting a call back. Will send outreach letter and allow pt time to call by over the next 10 days.  Raina Mina, RN Care Management Coordinator [place] Office [phone]

## 2017-05-27 ENCOUNTER — Telehealth: Payer: Self-pay | Admitting: Internal Medicine

## 2017-05-27 NOTE — Telephone Encounter (Signed)
Copied from [place]. Topic: Quick Communication - See Telephone Encounter >> May 27, 2017  3:29 PM Conception Chancy, NT wrote: CRM for notification. See Telephone encounter for:  05/27/17.  Patient states she is needing a refill on alendronate (FOSAMAX) 70 MG tablet. She states the prescription has expired. She also states this particular one she can not eat before taking it and would like to know if this can be switched. Patient would also like a call back from the nurse.   [facility], [place] [address]  [address] [place] [address]  Phone: [phone] Fax: [phone]

## 2017-05-28 NOTE — Telephone Encounter (Signed)
Pt called to report she has not been taking her Fosamax because she can not eat prior to taking it. She is asking if there is an alternative medication that will not restrict this.

## 2017-05-28 NOTE — Telephone Encounter (Signed)
Rhonda Reynolds can you see if prolia is covered by her insurance.  I am not sure if we have looked into this in the past - she is not tolerating fosamax.    Rhonda Reynolds - let her know we will see if we can get a different medication approved by her insurance - prolia.  Have her discontinue the fosamax.

## 2017-05-29 ENCOUNTER — Other Ambulatory Visit: Payer: Self-pay | Admitting: *Deleted

## 2017-05-29 ENCOUNTER — Encounter: Payer: Self-pay | Admitting: *Deleted

## 2017-05-29 NOTE — Patient Outreach (Signed)
[facility]) Care Management  05/29/2017  HOMER PFEIFER Apr 19, 1934 [id number]   RN spoke with pt today and introduced [facility] and the purpose for today's call. Pt receptive to the call and RN further discussed possible needs. Reviewed pt medical condition and possible needs. Pt stress and requested assistance with nutrition indicating she weighs 88 lbs but continues to smoke and does not wish to cease smoking at this time. RN discussed the importance of dietary consumption to maintain a health lifestyle in additional to quit smoking however offered to assist with educational information with a home visit (pt receptive). RN also discussed a plan of care and generated goals and interventions pt could start to work on to improve her overall health. Discussed the importance of 3 meals daily, hydration and maintaining or increase her weight capacity. Pt receptive to this care plan and will start working on these goals. RN informed pt that healthy food consumption and hydration is very importance in order for the body to function appropriately and fight off possible infections and/or disease that she can be at risk for encountering. Offered to further education on a plan of care that would incorporate prevention measures that she could continue to perform in managing her ongoing health. RN offered a home visit and will provide printable material and continue to review the goals and educate accordingly. Pt very appreciative and receptive to [facility] services with a scheduled home visit chosen by pt in 2 weeks. No additional inquires or request at this time.  Raina Mina, RN Care Management Coordinator [place] Office [phone]

## 2017-05-29 NOTE — Progress Notes (Signed)
Subjective:    Patient ID: Rhonda Reynolds, female    DOB: [birth year], 83 y.o.   MRN: [id number]  HPI She is here for a physical exam.   She has no concerns.  She is not currently taking Fosamax because the pharmacist advised her she cannot take it with coffee.  She always drinks coffee in the morning and is unsure how to take medication.  Medications and allergies reviewed with patient and updated if appropriate.  Patient Active Problem List   Diagnosis Date Noted  . Chronic pancreatitis ([place]) 01/09/2017  . Left fibular fracture 10/29/2016  . Abnormal CT of the chest 10/04/2016  . Ovarian cyst 10/04/2016  . 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 Abdominal pain 08/09/2016  . SOB (shortness of breath) 08/09/2016  . Palpable abdominal aorta 07/24/2016  . Diabetes mellitus without complication ([place]) 82/99/3716  . Chest pain 06/25/2016  . Protein-calorie malnutrition ([place]) 12/19/2015  . Sensorineural hearing loss of both ears 12/19/2015  . Weight loss 12/19/2015  . Tobacco use disorder 12/19/2015  . History of hyperthyroidism 12/19/2015  . Essential hypertension 12/18/2015  . PAD (peripheral artery disease) ([place]) 12/18/2015  . Osteoporosis 12/18/2015  . History of left breast cancer 12/18/2015  . Neoplasm of left breast, primary tumor staging category Tis: ductal carcinoma in situ (DCIS) 03/25/2013    Current Outpatient Medications on File Prior to Visit  Medication Sig Dispense Refill  . aspirin EC 81 MG tablet Take 81 mg by mouth daily.    . carboxymethylcellulose (REFRESH PLUS) 0.5 % SOLN 1 drop 3 (three) times daily as needed (DRY EYE).     . cholecalciferol (VITAMIN D) 1000 units tablet Take 3 tablets (3,000 Units total) by mouth daily. 90 tablet 1  . cycloSPORINE (RESTASIS) 0.05 % ophthalmic emulsion Place 1 drop into both eyes 2 (two) times daily. 0.4 mL 5  . guaiFENesin (MUCINEX) 600 MG 12 hr tablet Take by mouth.    . tiotropium (SPIRIVA HANDIHALER) 18 MCG inhalation  capsule      No current facility-administered medications on file prior to visit.     Past Medical History:  Diagnosis Date  . Arthritis   . Breast cancer ([place])    left  . Diabetes mellitus without complication ([place])   . GERD (gastroesophageal reflux disease)    occ  . History of radiation therapy 04/15/13-05/12/13   42.5 gray to left breast. lumpectomy cavity to 50 gray  . Hyperlipidemia   . Hypertension   . Hyperthyroidism   . Osteoporosis   . Personal history of radiation therapy 2015    Past Surgical History:  Procedure Laterality Date  . ABDOMINAL HYSTERECTOMY    . BREAST LUMPECTOMY Left 03/04/2013  . BREAST LUMPECTOMY WITH NEEDLE LOCALIZATION Left 03/04/2013   Procedure: LEFT BREAST LUMPECTOMY WITH NEEDLE LOCALIZATION;  Surgeon: Imogene Burn. Georgette Dover, MD;  Location: [place];  Service: General;  Laterality: Left;  . BREAST SURGERY    . EYE SURGERY Bilateral 16  . SPINE SURGERY  68    Social History   Socioeconomic History  . Marital status: Widowed    Spouse name: None  . Number of children: 4  . Years of education: None  . Highest education level: None  Social Needs  . Financial resource strain: Not very hard  . Food insecurity - worry: Sometimes true  . Food insecurity - inability: Sometimes true  . Transportation needs - medical: No  . Transportation needs - non-medical: No  Occupational History  .  None  Tobacco Use  . Smoking status: Current Every Day Smoker    Packs/day: 0.50    Years: 40.00    Pack years: 20.00    Types: Cigarettes  . Smokeless tobacco: Never Used  Substance and Sexual Activity  . Alcohol use: No  . Drug use: No  . Sexual activity: No  Other Topics Concern  . None  Social History Narrative   Widowed   Retired Control and instrumentation engineer    Family History  Problem Relation Age of Onset  . Cancer Mother        pt canot remember  . Diabetes Mother   . Stroke Mother   . Heart failure Father   . Lung cancer Brother     Review of  Systems  Constitutional: Negative for chills, fatigue and fever.  Eyes: Negative for visual disturbance.  Respiratory: Negative for cough, shortness of breath and wheezing.   Cardiovascular: Positive for palpitations (rare). Negative for chest pain and leg swelling.  Gastrointestinal: Negative for abdominal pain, constipation, diarrhea and nausea.       No gerd  Genitourinary: Negative for dysuria and hematuria.  Musculoskeletal: Positive for arthralgias (intermittent left knee), back pain (mild, chronic) and gait problem (mildly poor balance).  Skin: Negative for color change and rash.  Neurological: Negative for light-headedness and headaches.  Psychiatric/Behavioral: Negative for dysphoric mood. The patient is not nervous/anxious.        Objective:   Vitals:   05/30/17 1021  BP: (!) 152/94  Pulse: 89  Resp: 16  Temp: 98.2 F (36.8 C)  SpO2: 92%   Filed Weights   05/30/17 1021  Weight: 87 lb 12 oz (39.8 kg)    BP Readings from Last 3 Encounters:  05/30/17 (!) 152/94  05/13/17 127/78  04/23/17 102/66    Body mass index is 14.16 kg/m.  Wt Readings from Last 3 Encounters:  05/30/17 87 lb 12 oz (39.8 kg)  05/13/17 88 lb (39.9 kg)  04/23/17 86 lb (39 kg)     Physical Exam Constitutional: She appears well-developed and well-nourished. No distress.  HENT:  Head: Normocephalic and atraumatic.  Right Ear: External ear normal. Normal ear canal and TM Left Ear: External ear normal.  Normal ear canal and TM Mouth/Throat: Oropharynx is clear and moist.  Eyes: Conjunctivae and EOM are normal.  Neck: Neck supple. No tracheal deviation present. No thyromegaly present.  No carotid bruit  Cardiovascular: Normal rate, regular rhythm and normal heart sounds.   No murmur heard.  No edema. Pulmonary/Chest: Effort normal and breath sounds normal. No respiratory distress. She has no wheezes. She has no rales.  Breast: deferred Abdominal: Soft. She exhibits no distension. There  is no tenderness.  Lymphadenopathy: She has no cervical adenopathy.  Skin: Skin is warm and dry. She is not diaphoretic.  Psychiatric: She has a normal mood and affect. Her behavior is normal.        Assessment & Plan:   Physical exam: Screening blood work ordered Immunizations-up-to-date Colonoscopy   -no longer needed due to age 39 deferred additional mammograms Gyn   no longer following Dexa-discussed.  She would like to discuss this with the nurse coming to her house first to find out about co-pay Eye exams   Up to date  EKG  Last done 07/24/16 Exercise none.  Recommended and advised regular exercise for her bones Weight low-working on improving her diet and calorie intake Skin   no concerns Substance abuse   smoker-trying to keep  smoking minimal, no other abuse  See Problem List for Assessment and Plan of chronic medical problems.    Follow-up in 6 months

## 2017-05-29 NOTE — Telephone Encounter (Signed)
Patient's insurance will be verified and I will call patient to discuss summary of benefits 

## 2017-05-29 NOTE — Patient Instructions (Addendum)
Test(s) ordered today. Your results will be released to [place] (or called to you) after review, usually within 72hours after test completion. If any changes need to be made, you will be notified at that same time.  All other Health Maintenance issues reviewed.   All recommended immunizations and age-appropriate screenings are up-to-date or discussed.  No immunizations administered today.   Medications reviewed and updated.  No changes recommended at this time.  Your prescription(s) have been submitted to your pharmacy. Please take as directed and contact our office if you believe you are having problem(s) with the medication(s).   Please followup in 6 months   Health Maintenance, Female Adopting a healthy lifestyle and getting preventive care can go a long way to promote health and wellness. Talk with your health care provider about what schedule of regular examinations is right for you. This is a good chance for you to check in with your provider about disease prevention and staying healthy. In between checkups, there are plenty of things you can do on your own. Experts have done a lot of research about which lifestyle changes and preventive measures are most likely to keep you healthy. Ask your health care provider for more information. Weight and diet Eat a healthy diet  Be sure to include plenty of vegetables, fruits, low-fat dairy products, and lean protein.  Do not eat a lot of foods high in solid fats, added sugars, or salt.  Get regular exercise. This is one of the most important things you can do for your health. ? Most adults should exercise for at least 150 minutes each week. The exercise should increase your heart rate and make you sweat (moderate-intensity exercise). ? Most adults should also do strengthening exercises at least twice a week. This is in addition to the moderate-intensity exercise.  Maintain a healthy weight  Body mass index (BMI) is a measurement that can  be used to identify possible weight problems. It estimates body fat based on height and weight. Your health care provider can help determine your BMI and help you achieve or maintain a healthy weight.  For females 13 years of age and older: ? A BMI below 18.5 is considered underweight. ? A BMI of 18.5 to 24.9 is normal. ? A BMI of 25 to 29.9 is considered overweight. ? A BMI of 30 and above is considered obese.  Watch levels of cholesterol and blood lipids  You should start having your blood tested for lipids and cholesterol at 81 years of age, then have this test every 5 years.  You may need to have your cholesterol levels checked more often if: ? Your lipid or cholesterol levels are high. ? You are older than 81 years of age. ? You are at high risk for heart disease.  Cancer screening Lung Cancer  Lung cancer screening is recommended for adults 54-15 years old who are at high risk for lung cancer because of a history of smoking.  A yearly low-dose CT scan of the lungs is recommended for people who: ? Currently smoke. ? Have quit within the past 15 years. ? Have at least a 30-pack-year history of smoking. A pack year is smoking an average of one pack of cigarettes a day for 1 year.  Yearly screening should continue until it has been 15 years since you quit.  Yearly screening should stop if you develop a health problem that would prevent you from having lung cancer treatment.  Breast Cancer  Practice breast self-awareness.  This means understanding how your breasts normally appear and feel.  It also means doing regular breast self-exams. Let your health care provider know about any changes, no matter how small.  If you are in your 20s or 30s, you should have a clinical breast exam (CBE) by a health care provider every 1-3 years as part of a regular health exam.  If you are 29 or older, have a CBE every year. Also consider having a breast X-ray (mammogram) every year.  If you  have a family history of breast cancer, talk to your health care provider about genetic screening.  If you are at high risk for breast cancer, talk to your health care provider about having an MRI and a mammogram every year.  Breast cancer gene (BRCA) assessment is recommended for women who have family members with BRCA-related cancers. BRCA-related cancers include: ? Breast. ? Ovarian. ? Tubal. ? Peritoneal cancers.  Results of the assessment will determine the need for genetic counseling and BRCA1 and BRCA2 testing.  Cervical Cancer Your health care provider may recommend that you be screened regularly for cancer of the pelvic organs (ovaries, uterus, and vagina). This screening involves a pelvic examination, including checking for microscopic changes to the surface of your cervix (Pap test). You may be encouraged to have this screening done every 3 years, beginning at age 40.  For women ages 44-65, health care providers may recommend pelvic exams and Pap testing every 3 years, or they may recommend the Pap and pelvic exam, combined with testing for human papilloma virus (HPV), every 5 years. Some types of HPV increase your risk of cervical cancer. Testing for HPV may also be done on women of any age with unclear Pap test results.  Other health care providers may not recommend any screening for nonpregnant women who are considered low risk for pelvic cancer and who do not have symptoms. Ask your health care provider if a screening pelvic exam is right for you.  If you have had past treatment for cervical cancer or a condition that could lead to cancer, you need Pap tests and screening for cancer for at least 20 years after your treatment. If Pap tests have been discontinued, your risk factors (such as having a new sexual partner) need to be reassessed to determine if screening should resume. Some women have medical problems that increase the chance of getting cervical cancer. In these cases,  your health care provider may recommend more frequent screening and Pap tests.  Colorectal Cancer  This type of cancer can be detected and often prevented.  Routine colorectal cancer screening usually begins at 81 years of age and continues through 81 years of age.  Your health care provider may recommend screening at an earlier age if you have risk factors for colon cancer.  Your health care provider may also recommend using home test kits to check for hidden blood in the stool.  A small camera at the end of a tube can be used to examine your colon directly (sigmoidoscopy or colonoscopy). This is done to check for the earliest forms of colorectal cancer.  Routine screening usually begins at age 96.  Direct examination of the colon should be repeated every 5-10 years through 81 years of age. However, you may need to be screened more often if early forms of precancerous polyps or small growths are found.  Skin Cancer  Check your skin from head to toe regularly.  Tell your health care provider about any  new moles or changes in moles, especially if there is a change in a mole's shape or color.  Also tell your health care provider if you have a mole that is larger than the size of a pencil eraser.  Always use sunscreen. Apply sunscreen liberally and repeatedly throughout the day.  Protect yourself by wearing long sleeves, pants, a wide-brimmed hat, and sunglasses whenever you are outside.  Heart disease, diabetes, and high blood pressure  High blood pressure causes heart disease and increases the risk of stroke. High blood pressure is more likely to develop in: ? People who have blood pressure in the high end of the normal range (130-139/85-89 mm Hg). ? People who are overweight or obese. ? People who are African American.  If you are 58-1 years of age, have your blood pressure checked every 3-5 years. If you are 52 years of age or older, have your blood pressure checked every year.  You should have your blood pressure measured twice-once when you are at a hospital or clinic, and once when you are not at a hospital or clinic. Record the average of the two measurements. To check your blood pressure when you are not at a hospital or clinic, you can use: ? An automated blood pressure machine at a pharmacy. ? A home blood pressure monitor.  If you are between 24 years and 65 years old, ask your health care provider if you should take aspirin to prevent strokes.  Have regular diabetes screenings. This involves taking a blood sample to check your fasting blood sugar level. ? If you are at a normal weight and have a low risk for diabetes, have this test once every three years after 81 years of age. ? If you are overweight and have a high risk for diabetes, consider being tested at a younger age or more often. Preventing infection Hepatitis B  If you have a higher risk for hepatitis B, you should be screened for this virus. You are considered at high risk for hepatitis B if: ? You were born in a country where hepatitis B is common. Ask your health care provider which countries are considered high risk. ? Your parents were born in a high-risk country, and you have not been immunized against hepatitis B (hepatitis B vaccine). ? You have HIV or AIDS. ? You use needles to inject street drugs. ? You live with someone who has hepatitis B. ? You have had sex with someone who has hepatitis B. ? You get hemodialysis treatment. ? You take certain medicines for conditions, including cancer, organ transplantation, and autoimmune conditions.  Hepatitis C  Blood testing is recommended for: ? Everyone born from [birth year] through 1963. ? Anyone with known risk factors for hepatitis C.  Sexually transmitted infections (STIs)  You should be screened for sexually transmitted infections (STIs) including gonorrhea and chlamydia if: ? You are sexually active and are younger than 81 years of  age. ? You are older than 81 years of age and your health care provider tells you that you are at risk for this type of infection. ? Your sexual activity has changed since you were last screened and you are at an increased risk for chlamydia or gonorrhea. Ask your health care provider if you are at risk.  If you do not have HIV, but are at risk, it may be recommended that you take a prescription medicine daily to prevent HIV infection. This is called pre-exposure prophylaxis (PrEP). You are considered at  risk if: ? You are sexually active and do not regularly use condoms or know the HIV status of your partner(s). ? You take drugs by injection. ? You are sexually active with a partner who has HIV.  Talk with your health care provider about whether you are at high risk of being infected with HIV. If you choose to begin PrEP, you should first be tested for HIV. You should then be tested every 3 months for as long as you are taking PrEP. Pregnancy  If you are premenopausal and you may become pregnant, ask your health care provider about preconception counseling.  If you may become pregnant, take 400 to 800 micrograms (mcg) of folic acid every day.  If you want to prevent pregnancy, talk to your health care provider about birth control (contraception). Osteoporosis and menopause  Osteoporosis is a disease in which the bones lose minerals and strength with aging. This can result in serious bone fractures. Your risk for osteoporosis can be identified using a bone density scan.  If you are 6 years of age or older, or if you are at risk for osteoporosis and fractures, ask your health care provider if you should be screened.  Ask your health care provider whether you should take a calcium or vitamin D supplement to lower your risk for osteoporosis.  Menopause may have certain physical symptoms and risks.  Hormone replacement therapy may reduce some of these symptoms and risks. Talk to your health  care provider about whether hormone replacement therapy is right for you. Follow these instructions at home:  Schedule regular health, dental, and eye exams.  Stay current with your immunizations.  Do not use any tobacco products including cigarettes, chewing tobacco, or electronic cigarettes.  If you are pregnant, do not drink alcohol.  If you are breastfeeding, limit how much and how often you drink alcohol.  Limit alcohol intake to no more than 1 drink per day for nonpregnant women. One drink equals 12 ounces of beer, 5 ounces of wine, or 1 ounces of hard liquor.  Do not use street drugs.  Do not share needles.  Ask your health care provider for help if you need support or information about quitting drugs.  Tell your health care provider if you often feel depressed.  Tell your health care provider if you have ever been abused or do not feel safe at home. This information is not intended to replace advice given to you by your health care provider. Make sure you discuss any questions you have with your health care provider. Document Released: 09/25/2010 Document Revised: 08/18/2015 Document Reviewed: 12/14/2014 Elsevier Interactive Patient Education  [employer].

## 2017-05-30 ENCOUNTER — Ambulatory Visit (INDEPENDENT_AMBULATORY_CARE_PROVIDER_SITE_OTHER): Payer: Medicare Other | Admitting: Internal Medicine

## 2017-05-30 ENCOUNTER — Other Ambulatory Visit (INDEPENDENT_AMBULATORY_CARE_PROVIDER_SITE_OTHER): Payer: Medicare Other

## 2017-05-30 ENCOUNTER — Encounter: Payer: Self-pay | Admitting: Internal Medicine

## 2017-05-30 VITALS — BP 152/94 | HR 89 | Temp 98.2°F | Resp 16 | Wt 87.8 lb

## 2017-05-30 DIAGNOSIS — Z0000 Encounter for general adult medical examination without abnormal findings: Secondary | ICD-10-CM | POA: Diagnosis not present

## 2017-05-30 DIAGNOSIS — M810 Age-related osteoporosis without current pathological fracture: Secondary | ICD-10-CM

## 2017-05-30 DIAGNOSIS — E119 Type 2 diabetes mellitus without complications: Secondary | ICD-10-CM | POA: Diagnosis not present

## 2017-05-30 DIAGNOSIS — I10 Essential (primary) hypertension: Secondary | ICD-10-CM | POA: Diagnosis not present

## 2017-05-30 DIAGNOSIS — E46 Unspecified protein-calorie malnutrition: Secondary | ICD-10-CM | POA: Diagnosis not present

## 2017-05-30 LAB — CBC WITH DIFFERENTIAL/PLATELET
BASOS PCT: 0.8 % (ref 0.0–3.0)
Basophils Absolute: 0.1 10*3/uL (ref 0.0–0.1)
EOS PCT: 1.5 % (ref 0.0–5.0)
Eosinophils Absolute: 0.1 10*3/uL (ref 0.0–0.7)
HEMATOCRIT: 46.6 % — AB (ref 36.0–46.0)
HEMOGLOBIN: 15.5 g/dL — AB (ref 12.0–15.0)
Lymphocytes Relative: 24 % (ref 12.0–46.0)
Lymphs Abs: 1.6 10*3/uL (ref 0.7–4.0)
MCHC: 33.2 g/dL (ref 30.0–36.0)
MCV: 91.6 fl (ref 78.0–100.0)
MONOS PCT: 9.1 % (ref 3.0–12.0)
Monocytes Absolute: 0.6 10*3/uL (ref 0.1–1.0)
Neutro Abs: 4.3 10*3/uL (ref 1.4–7.7)
Neutrophils Relative %: 64.6 % (ref 43.0–77.0)
Platelets: 214 10*3/uL (ref 150.0–400.0)
RBC: 5.09 Mil/uL (ref 3.87–5.11)
RDW: 13.9 % (ref 11.5–15.5)
WBC: 6.6 10*3/uL (ref 4.0–10.5)

## 2017-05-30 LAB — COMPREHENSIVE METABOLIC PANEL
ALBUMIN: 4.7 g/dL (ref 3.5–5.2)
ALK PHOS: 72 U/L (ref 39–117)
ALT: 9 U/L (ref 0–35)
AST: 21 U/L (ref 0–37)
BUN: 14 mg/dL (ref 6–23)
CO2: 30 mEq/L (ref 19–32)
Calcium: 10.5 mg/dL (ref 8.4–10.5)
Chloride: 100 mEq/L (ref 96–112)
Creatinine, Ser: 0.69 mg/dL (ref 0.40–1.20)
GFR: 105.15 mL/min (ref >60.00)
Glucose, Bld: 101 mg/dL — ABNORMAL HIGH (ref 70–99)
POTASSIUM: 4.9 meq/L (ref 3.5–5.1)
SODIUM: 138 meq/L (ref 135–145)
TOTAL PROTEIN: 8.3 g/dL (ref 6.0–8.3)
Total Bilirubin: 0.5 mg/dL (ref 0.2–1.2)

## 2017-05-30 LAB — LIPID PANEL
CHOL/HDL RATIO: 2
CHOLESTEROL: 178 mg/dL (ref 0–200)
HDL: 74.4 mg/dL (ref >39.00)
LDL CALC: 91 mg/dL (ref 0–99)
NonHDL: 103.32
TRIGLYCERIDES: 63 mg/dL (ref 0.0–149.0)
VLDL: 12.6 mg/dL (ref 0.0–40.0)

## 2017-05-30 LAB — MICROALBUMIN / CREATININE URINE RATIO
Creatinine,U: 124.4 mg/dL
Microalb Creat Ratio: 10.5 mg/g (ref 0.0–30.0)
Microalb, Ur: 13 mg/dL — ABNORMAL HIGH (ref 0.0–1.9)

## 2017-05-30 LAB — HEMOGLOBIN A1C: HEMOGLOBIN A1C: 6.6 % — AB (ref 4.6–6.5)

## 2017-05-30 LAB — VITAMIN D 25 HYDROXY (VIT D DEFICIENCY, FRACTURES): VITD: 43.48 ng/mL (ref 30.00–100.00)

## 2017-05-30 LAB — TSH: TSH: 1.18 u[IU]/mL (ref 0.35–4.50)

## 2017-05-30 MED ORDER — ALENDRONATE SODIUM 70 MG PO TABS: 70.0000 mg | ORAL_TABLET | ORAL | 3 refills | Status: DC

## 2017-05-30 NOTE — Assessment & Plan Note (Signed)
Advised to restart taking Fosamax-take with only a couple sips of coffee-this is better than nothing We will look into the cost of Prolia, but most likely she would like to stay on the Fosamax Deferred DEXA today-wants to make sure there is no co-pay Encouraged regular exercise We will check vitamin D level-she is taking vitamin D daily

## 2017-05-30 NOTE — Assessment & Plan Note (Signed)
BP Readings from Last 3 Encounters:  05/30/17 (!) 152/94  05/13/17 127/78  04/23/17 102/66   BP well controlled Not on medication, controlled w/o medication cmp

## 2017-05-30 NOTE — Assessment & Plan Note (Signed)
Working on improving diet - does not eat much No exercise Will check a1c

## 2017-05-30 NOTE — Assessment & Plan Note (Signed)
She is working on improving her nutrition-both her calorie intake and high quality foods

## 2017-05-31 ENCOUNTER — Ambulatory Visit
Admission: RE | Admit: 2017-05-31 | Discharge: 2017-05-31 | Disposition: A | Discharge status: Home / Self Care | Payer: Medicare Other | Source: Ambulatory Visit | Attending: Internal Medicine | Admitting: Internal Medicine

## 2017-05-31 DIAGNOSIS — R922 Inconclusive mammogram: Secondary | ICD-10-CM | POA: Diagnosis not present

## 2017-05-31 DIAGNOSIS — Z853 Personal history of malignant neoplasm of breast: Secondary | ICD-10-CM

## 2017-06-13 ENCOUNTER — Ambulatory Visit: Payer: Self-pay

## 2017-06-13 ENCOUNTER — Ambulatory Visit: Payer: Self-pay | Admitting: *Deleted

## 2017-06-13 ENCOUNTER — Other Ambulatory Visit: Payer: Self-pay | Admitting: *Deleted

## 2017-06-13 NOTE — Telephone Encounter (Signed)
Summary: high bp   Lattie Haw with [facility] seeing pt today and reports bp 160/90 left 164/88. Pt is not having any symptoms related to the pressure. However. Pt is not on bp med, and pt is a smoker.  Lattie Haw is there for nutritional training, but ran into the bp issues.  She will send report to the doctor as well. [phone]      Phone call to pt.  Advised of being made aware of elevated BP readings per Elpidio Eric, nurse with [facility].   Pt. Stated she is in a meeting with Raina Mina at this time.  Questioned if pt. Having any symptoms with elevated BP?  Pt. Stated she feels fine.  Spoke with Elpidio Eric , RN.  Reported that during her nutritional consultation today, noted elevated BP.  Reported pt. admitted to being nervous about today's visit.  Reported pt. Is a smoker, and admitted to smoking 3 cigarettes prior to her arrival today.  Stated the pt. Is asymptomatic with elevated BP.  Per Lattie Haw, RN, if Dr. Quay Burow wants pt. to monitor her BP's at home, [facility] can provide a BP monitor, at no cost to the pt.  Stated she will be visiting the pt. monthly, re: her nutritional status and providing education.  Guy Franco, will send message to Dr. Quay Burow.

## 2017-06-13 NOTE — Telephone Encounter (Signed)
Attempted to return call to Elpidio Eric with [facility] re: report of elevated BP.  Left voice message that Triage nurse will f/u with the pt. By phone to discuss elevated BP.

## 2017-06-13 NOTE — Patient Outreach (Signed)
[place] [facility]) Care Management   06/13/2017  Rhonda Reynolds [birth year] [id number]  Rhonda Reynolds is an 83 y.o. 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 Pt states nibbles throughout the day with breakfast usually in the morning.  Pt reports she soft foods and indicates she can eat more foods with this diet. Reports her bowels are good with some ocassional constipation but manageable. HTN: Pt reports she recently had a visit with Dr. Quay Burow earlier in this month with her BP elevated. The triage nurse calls back while RN remains visiting in the home and reports her BP on last visit (3/7) was 152/94. No interventions provided during this call as nurse will report all information to Dr. Quay Burow for pending intervention. Will contact pt directly with such interventions.   Objective:   Review of Systems  Constitutional: Negative.   HENT: Negative.   Eyes: Negative.   Respiratory: Negative.   Cardiovascular: Negative.   Gastrointestinal: Negative.   Genitourinary: Negative.   Musculoskeletal: Negative.   Skin: Negative.   Neurological: Negative.   Endo/Heme/Allergies: Negative.   Psychiatric/Behavioral: Negative.     Physical Exam  Constitutional: She is oriented to person, place, and time. She appears well-developed and well-nourished.  HENT:  Right Ear: External ear normal.  Left Ear: External ear normal.  Eyes: EOM are normal.  Neck: Normal range of motion.  Cardiovascular: Normal heart sounds.  Respiratory: Effort normal and breath sounds normal.  GI: Soft. Bowel sounds are normal.  Musculoskeletal: Normal range of motion.  Neurological: She is alert and oriented to person, place, and time.  Skin: Skin is warm and dry.  Psychiatric: She has a normal mood and affect. Her behavior is normal. Judgment and thought content normal.     Encounter Medications:   Outpatient Encounter Medications as of 06/13/2017  Medication Sig  . alendronate (FOSAMAX) 70 MG tablet Take 1 tablet (70 mg total) by mouth once a week. Take with a full glass of water on an empty stomach.  Marland Kitchen aspirin EC 81 MG tablet Take 81 mg by mouth daily.  . carboxymethylcellulose (REFRESH PLUS) 0.5 % SOLN 1 drop 3 (three) times daily as needed (DRY EYE).   . cholecalciferol (VITAMIN D) 1000 units tablet Take 3 tablets (3,000 Units total) by mouth daily.  . cycloSPORINE (RESTASIS) 0.05 % ophthalmic emulsion Place 1 drop into both eyes 2 (two) times daily.  Marland Kitchen guaiFENesin (MUCINEX) 600 MG 12 hr tablet Take by mouth.  . tiotropium (SPIRIVA HANDIHALER) 18 MCG inhalation capsule    No facility-administered encounter medications on file as of 06/13/2017.     Functional Status:   In your present state of health, do you have any difficulty performing the following activities: 05/29/2017 05/13/2017  Hearing? N N  Vision? N N  Difficulty concentrating or making decisions? N N  Walking or climbing stairs? N N  Dressing or bathing? N N  Doing errands, shopping? N N  Preparing Food and eating ? N N  Using the Toilet? N N  In the past Reynolds months, have you accidently leaked urine? N N  Do you have problems with loss of bowel control? N N  Managing your Medications? N N  Managing your Finances? N N  Housekeeping or managing your Housekeeping? N N  Some recent data might be hidden    Fall/Depression  Screening:    Fall Risk  05/29/2017 05/13/2017 01/18/2017  Falls in the past year? Yes Yes Yes  Number falls in past yr: 2 or more 1 1  Injury with Fall? Yes Yes Yes  Risk for fall due to : Impaired balance/gait - -  Follow up Falls prevention discussed Falls prevention discussed -   PHQ 2/9 Scores 05/29/2017 05/13/2017 01/18/2017 12/19/2015  PHQ - 2 Score 0 0 0 0  PHQ- 9 Score - 3 - -   BP (!) 160/90 (BP Location: Left Arm, Patient Position: Sitting, Cuff Size: Normal)    Resp 20   Ht 1.702 m (5\' 7" )   Wt 89 lb (40.4 kg)   BMI 13.94 kg/m  Assessment:   Nutritional issues related to health eating habits (malnutrition) HTN related to elevated   Plan:  Will discussed nutritional issues related to pt's lack of appetite and decreased knowledge based related to risk involved if non-adherent. Will discussed printed material on nutrition related to EMMI (Balance Diet, Diet and Health and Nutritional Support) for increase knowledge base on health eating habits.  Will stress the importance of hydration and risk involved related to lack of nutrition and hydration. Will discussed meal replacement supplements (Ensure/BOOST ect) all contribute to healthy habits.  Will discussed HTN risk and contact pt's primary concerning today's elevated BP with right-160/90 and left-164/88 as confirmed pt is not on any BP medications however continue to smoke. RN responded to a call back and again reiterated on the symptoms and bilateral BP reading with no symptoms encountered. Confirmed triage nurse will report to Dr. Quay Burow office and contact pt directly with any interventions. Will offer to provide ongoing education and to provide pt with a BP cuff if provider feels pt needs further intervention on this diagnosis. Will education on risk and symptoms and what to do if acute symptoms should occur. Plan of care review with goals and interventions and pt very receptive to all that was discussed. Will scheduled another home visit next month and continue to educate pt accordingly with the above issues. Will also fax today's report to primary provider's office. Verbal consent completed however will mail written consent with instruction to sign and return to the [facility] office.   Raina Mina, RN Care Management Coordinator [place] Office [phone]

## 2017-06-13 NOTE — Telephone Encounter (Signed)
Charted in error.

## 2017-06-13 NOTE — Telephone Encounter (Signed)
She was on medication at one point, but has been off of it.  Her blood pressure has been controlled, but it was elevated at her last visit.  I would like her to have her own blood pressure monitor and for her to start monitoring her blood pressure at home.  If it is frequently more than 140/90 we will need to restart medication.

## 2017-06-18 NOTE — Telephone Encounter (Signed)
Spoke with pt, states that [place] with [facility] is supposed to be bringing her a BP cuff. Advised pt that she should take her BP if she is to go to a pharmacy or store that has a cuff. She stated that she could also go to the fire dept up the street and have it taken there. Informed her what he BP should be and if she experiences any symptoms of high BP to give [place] a call and let us know.

## 2017-06-28 DIAGNOSIS — D0512 Intraductal carcinoma in situ of left breast: Secondary | ICD-10-CM | POA: Diagnosis not present

## 2017-07-01 NOTE — Telephone Encounter (Signed)
Patient's insurance was submitted and verified.  Pt is responsible for a $245 copay for the Prolia injection.  I called and explained this to the patient. She said that she does not want to proceed with this due to cost.  Please advise.

## 2017-07-01 NOTE — Telephone Encounter (Signed)
She is on fosamax - will continue

## 2017-07-18 ENCOUNTER — Other Ambulatory Visit: Payer: Self-pay | Admitting: *Deleted

## 2017-07-18 NOTE — Patient Outreach (Signed)
[facility]) Care Management   07/18/2017  RIM THATCH June 28, 1934 [id number]  PAMLEA FINDER is an 83 y.o. female  Subjective: NUTRITIONAL: Pt states she is consume 2 1/2 meals working on trying to consume 3 meals a day. States she is having healthier eating habits but still working on her snacks. Pt has increased her fruits intake with no problems. Pt will also try to increased her H2O intake for ongoing hydration. Pt does not have scale for daily weights but feels she remains at 88 lbs.  HTN: Pt reports her BP was elevated on one day that she had a headache and visited the local fire dept who indicated her BP was elevated. Pt did not visit the ED instead came back home and "layed down". States eventually the headache will go away.  Pt also states he may have been high due to the recent bed-bug incident where they has to exterminate her apartment to get rid of the bugs. Pt states she was not aware that she had the bugs but this issues has resolved.  Pt does not have a BP device but will to complete daily monitoring for managing her BP.  Objective:   Review of Systems  Constitutional: Negative.   HENT: Negative.   Eyes: Negative.   Respiratory: Negative.   Cardiovascular: Negative.   Gastrointestinal: Negative.   Genitourinary: Negative.   Musculoskeletal: Negative.   Skin: Negative.   Neurological: Negative.   Endo/Heme/Allergies: Negative.   Psychiatric/Behavioral: Negative.     Physical Exam  Constitutional: She is oriented to person, place, and time. She appears well-developed and well-nourished.  HENT:  Right Ear: External ear normal.  Left Ear: External ear normal.  Eyes: EOM are normal.  Neck: Normal range of motion. Neck supple.  Cardiovascular: Normal heart sounds.  Respiratory: Effort normal and breath sounds normal.  GI: Soft. Bowel sounds are normal.  Musculoskeletal: Normal range of motion.  Neurological: She is alert and oriented to person,  place, and time.  Skin: Skin is warm and dry.  Psychiatric: She has a normal mood and affect. Her behavior is normal. Judgment and thought content normal.    Encounter Medications:   Outpatient Encounter Medications as of 07/18/2017  Medication Sig  . alendronate (FOSAMAX) 70 MG tablet Take 1 tablet (70 mg total) by mouth once a week. Take with a full glass of water on an empty stomach.  Marland Kitchen aspirin EC 81 MG tablet Take 81 mg by mouth daily.  . carboxymethylcellulose (REFRESH PLUS) 0.5 % SOLN 1 drop 3 (three) times daily as needed (DRY EYE).   . cholecalciferol (VITAMIN D) 1000 units tablet Take 3 tablets (3,000 Units total) by mouth daily.  . cycloSPORINE (RESTASIS) 0.05 % ophthalmic emulsion Place 1 drop into both eyes 2 (two) times daily.  Marland Kitchen guaiFENesin (MUCINEX) 600 MG 12 hr tablet Take by mouth.  . tiotropium (SPIRIVA HANDIHALER) 18 MCG inhalation capsule    No facility-administered encounter medications on file as of 07/18/2017.     Functional Status:   In your present state of health, do you have any difficulty performing the following activities: 05/29/2017 05/13/2017  Hearing? N N  Vision? N N  Difficulty concentrating or making decisions? N N  Walking or climbing stairs? N N  Dressing or bathing? N N  Doing errands, shopping? N N  Preparing Food and eating ? N N  Using the Toilet? N N  In the past six months, have you accidently leaked urine? N  N  Do you have problems with loss of bowel control? N N  Managing your Medications? N N  Managing your Finances? N N  Housekeeping or managing your Housekeeping? N N  Some recent data might be hidden    Fall/Depression Screening:    Fall Risk  05/29/2017 05/13/2017 01/18/2017  Falls in the past year? Yes Yes Yes  Number falls in past yr: 2 or more 1 1  Injury with Fall? Yes Yes Yes  Risk for fall due to : Impaired balance/gait - -  Follow up Falls prevention discussed Falls prevention discussed -   PHQ 2/9 Scores 05/29/2017 05/13/2017  01/18/2017 12/19/2015  PHQ - 2 Score 0 0 0 0  PHQ- 9 Score - 3 - -   BP 120/64 (BP Location: Left Arm, Patient Position: Sitting, Cuff Size: Small)   Pulse (!) 108   Resp 20   Ht 1.676 m (5\' 6" )   Wt 88 lb (39.9 kg)   SpO2 94%   BMI 14.20 kg/m  Assessment:   Case management related to nutritional habits/changes HTN related to history of BP  Plan:  Will discussed pt's nutritional habits and consumption of food items and hydration with increase with water. Will discussed options to improving her dietary habits with nutritional supplements in maintaining her weight currently at 88 lbs. Discussed food items and how to continue to improve his dietary habits with healthy food items.  Will request [facility] office to mail pt a scale for future weights and will encouraged pt to document all readings in the [facility] calendar for providers to view.  Will monitoring pt's BP and provider a home device for ongoing monitoring. Will demonstrate how to use the BP device correctly and received teach-back method on pt's understanding on how to monitor her BP and when to report signs and symptoms to her provider.  Plan of care discussed with the goals and interventions. Will also generate goal for pt's HTN monitoring. Will re-evaluate pt's care plan next month and adjust accordingly to pt's progress. No other inquires or request at this time as RN will rescheduled a home visit next month.  Raina Mina, RN Care Management Coordinator [place] Office [phone]

## 2017-08-22 ENCOUNTER — Other Ambulatory Visit: Payer: Self-pay | Admitting: *Deleted

## 2017-08-22 ENCOUNTER — Ambulatory Visit: Payer: Self-pay | Admitting: *Deleted

## 2017-08-22 NOTE — Patient Outreach (Signed)
[place] [facility]) Care Management  08/22/2017  Rhonda Reynolds 10-01-1934 [id number]    Telephone Assessment  RN had a scheduled home visit however pt called and cancelled due to bed bug reoccurring in the home. Pt states she had to called the exterminator back in because the bugs reappeared. RN indicated she is very stressed with the whole incident. States she took her BP last week and it was "okay" but problem elevated once again due to this incident. Pt grateful for my understanding and states I am the only one she can report this information to and feel comfortable. RN offered a telephone assessment today and will reschedule for a home visit next month (pt receptive). Completed the assessment and discussed the current plan of care with two goals met and interventions adjusted according to pt's progress. Will continue to encouraged hydration based upon the summer months with high temperatures and stress the importance of nutritional needs with consuming 3 meals daily with healthy snacks in between her meals. Pt verbalized an understanding and will continue to work on these goals. Will verify pt continue to have sufficient transportation services to all her appointments and has had no medications changes since RN last home visit with again adherence to all her daily medications. No request or inquires at this time as pt continue to be receptive to the current plan of care and will work to importance her management of care. Pt remains aware that RN communicates with her provider and will send an update next month after the home visit. RN also stress to pt to remove herself from a stressful environment if this affects her health. Pt states she has family in [place] and may visit them soon. RN has informed pt to continue RN if there is any other needs or resources needed. Will follow up next month as planned for the next home visit.  Raina Mina, RN Care Management Coordinator [place] Office [phone]

## 2017-09-12 ENCOUNTER — Other Ambulatory Visit: Payer: Self-pay | Admitting: *Deleted

## 2017-09-12 NOTE — Patient Outreach (Signed)
[place] [facility]) Care Management  09/12/2017  Rhonda Reynolds [birth year] [id number]    Telephone Assessment  RN spoke with pt today and verified her infestation of bedbugs have resolved with the terminators and pt if clear for a home visit tomorrow. States she is having a problem with getting a BP reading from her device but believes she is "doing it incorrectly". Will provide instructions on the correct positioning and reassess during the home visit tomorrow. Will update plan of care and further assess pt during that time.   Raina Mina, RN Care Management Coordinator [place] Office [phone]

## 2017-09-13 ENCOUNTER — Other Ambulatory Visit: Payer: Self-pay | Admitting: *Deleted

## 2017-09-13 NOTE — Patient Outreach (Signed)
[facility] ([facility]) Care Management   09/13/2017  Rhonda Reynolds 02/01/1935 [id number]  Rhonda Reynolds is an 83 y.o. female  Subjective:  NUTRITIONAL: Pt reports she is trying to eat more food items however on a budget and will be going to the grocery store today and obtain some food items that  Will continue to assist with her ongoing weight gain. Pt admits she eats some snacks but not has heavier but will attempt to increase some of her food items to gain additional weight. Pt reports she has consulted with a nutritionist in the past but has opted not to consult another visit at this time. Pt has agreed that she will consult if she continues to loss ongoing weight but will attempt to correct this on her own first.  HTN: Pt reports her BP device is not working and she need RN to troubleshoot the device or check if she is positioning the device correctly for a readings. Note pt states she has been obtaining a read however unsuccessful over the last few days. Denies any symptoms of high or low BP but still she remains concern. SMOKING: Pt request a discussion or education on a pack of NicoDerm patches she will start this month for stop smoking. Pt aware of the risk and indicated she wants to finish smoking her before she starts this program.   Objective:   Review of Systems  All other systems reviewed and are negative.   Physical Exam  Constitutional: She is oriented to person, place, and time. She appears well-developed and well-nourished.  HENT:  Right Ear: External ear normal.  Left Ear: External ear normal.  Eyes: EOM are normal.  Neck: Normal range of motion.  Cardiovascular: Normal heart sounds.  Respiratory: Effort normal and breath sounds normal.  GI: Soft. Bowel sounds are normal.  Musculoskeletal: Normal range of motion.  Neurological: She is alert and oriented to person, place, and time.  Skin: Skin is warm and dry.  Psychiatric: She has a normal mood and  affect. Her behavior is normal. Judgment and thought content normal.    Encounter Medications:   Outpatient Encounter Medications as of 09/13/2017  Medication Sig  . alendronate (FOSAMAX) 70 MG tablet Take 1 tablet (70 mg total) by mouth once a week. Take with a full glass of water on an empty stomach.  . aspirin EC 81 MG tablet Take 81 mg by mouth daily.  . carboxymethylcellulose (REFRESH PLUS) 0.5 % SOLN 1 drop 3 (three) times daily as needed (DRY EYE).   . cholecalciferol (VITAMIN D) 1000 units tablet Take 3 tablets (3,000 Units total) by mouth daily.  . cycloSPORINE (RESTASIS) 0.05 % ophthalmic emulsion Place 1 drop into both eyes 2 (two) times daily.  . guaiFENesin (MUCINEX) 600 MG 12 hr tablet Take by mouth.  . tiotropium (SPIRIVA HANDIHALER) 18 MCG inhalation capsule    No facility-administered encounter medications on file as of 09/13/2017.     Functional Status:   In your present state of health, do you have any difficulty performing the following activities: 05/29/2017 05/13/2017  Hearing? N N  Vision? N N  Difficulty concentrating or making decisions? N N  Walking or climbing stairs? N N  Dressing or bathing? N N  Doing errands, shopping? N N  Preparing Food and eating ? N N  Using the Toilet? N N  In the past six months, have you accidently leaked urine? N N  Do you have problems with loss of bowel   control? N N  Managing your Medications? N N  Managing your Finances? N N  Housekeeping or managing your Housekeeping? N N  Some recent data might be hidden    Fall/Depression Screening:    Fall Risk  05/29/2017 05/13/2017 01/18/2017  Falls in the past year? Yes Yes Yes  Number falls in past yr: 2 or more 1 1  Injury with Fall? Yes Yes Yes  Risk for fall due to : Impaired balance/gait - -  Follow up Falls prevention discussed Falls prevention discussed -   PHQ 2/9 Scores 05/29/2017 05/13/2017 01/18/2017 12/19/2015  PHQ - 2 Score 0 0 0 0  PHQ- 9 Score - 3 - -    BP 134/82 (BP  Location: Left Arm, Patient Position: Sitting, Cuff Size: Normal)   Pulse 94   Resp 20   Wt 84 lb 6.4 oz (38.3 kg)   SpO2 97%   BMI 13.62 kg/m   Assessment:  Ongoing case management related to nutritional needs Troubleshoot BP device related to ERROR message Educate on smoking cessation related to NicoDerm patch  Plan:  Will verify pt continue to consume healthy meals however RN will strongly encouraged heavy snacks in between meals for ongoing weight gain. Will verify pt's weight was 88 lbs several months ago and now 84.4 lbs today on provided scales. Educated what the pancreatis does and how this may affect her weight. Will inquired on food items and continue to enhance pt's knowledge with other items. Will also offer consult on a nutritionist however pt opt to decline but has consulted with one in the past.  Will continue to encouraged 6 small meals or 3 heavier meals with several snacks throughout the day. Will also discuss medication or powdered drinks OTC that may assist with increasing her appetite to eat more food items.  Discuss if pt's weight gets to low at 80 lbs encouraged pt to consult with her provider on possible interventions. Will troubleshoot the BP device now functional with a her reading at 155/85 as pt remains asymptomatic with no signs or symptoms over the last month since RN last home visit. No additional problems with pt's BP. Will continue to encouraged daily documentation for her providers to view on office visits.  Will educate on NicoDerm patch and encouraged pt to discuss this treatment further with her provider. Will encouraged pt not to smoke while on this OTC medication and to follow either the instructions of recommendation by her provider. Plan of care reviewed and interventions adjusted accordingly. Will follow up next month telephonically with plans for possible discharge or graduation.  [facility] CM Care Plan Problem One     Most Recent Value  Care Plan Problem One   Lack of knowledge with nutritional habits  Role Documenting the Problem One  Care Management Coordinator  Care Plan for Problem One  Active  [facility] Long Term Goal   Pt will learn to consume 3 meals daily over the next 60 day.  [facility] Long Term Goal Start Date  05/29/17 [Extend July to allow adherence base upon the interventions  ]  Interventions for Problem One Long Term Goal  Will discuss food items that may help with weight gain and low in sodium due to her HTN history.. WIll continue to stress hydrations and maintain a low stress levels.    [facility] CM Care Plan Problem Two     Most Recent Value  Care Plan Problem Two  Hx elevated BP related to HTN   Role  Documenting the Problem Two  Care Management Coordinator  Care Plan for Problem Two  Not Active  [facility] CM Short Term Goal #1   Pt will obtain her BP readings with documentation over the next 30 days  [facility] CM Short Term Goal #1 Start Date  07/18/17  [facility] CM Short Term Goal #1 Met Date   09/13/17     Raina Mina, RN Care Management Coordinator [place] Office [phone]

## 2017-09-18 ENCOUNTER — Encounter: Payer: Self-pay | Admitting: *Deleted

## 2017-09-22 IMAGING — CT CT ABD-PELV W/ CM
2 of 5 series · 15 of 46 positions shown, 17 images · IV contrast (ISOVUE 300)
Comparison: None.

CLINICAL DATA: Chronic periumbilical abdominal pain. Chronic
pancreatitis. Recent 20 pound weight loss.

EXAM:
CT ABDOMEN AND PELVIS WITH CONTRAST
TECHNIQUE: Multidetector CT imaging of the abdomen and pelvis was performed
using the standard protocol following bolus administration of
intravenous contrast.
CONTRAST:  75mL 8YUAB0-R00 IOPAMIDOL (8YUAB0-R00) INJECTION 61%

[Series 2: abd/pel w · axial · 0.59mm/px · z∈[-473,-128]mm · 12 of 79 slices shown, 14 images]
[im 5/79  soft-tissue]
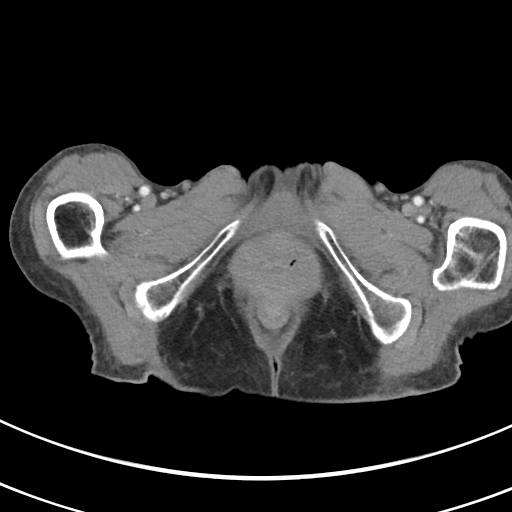
[im 5/79  bone]
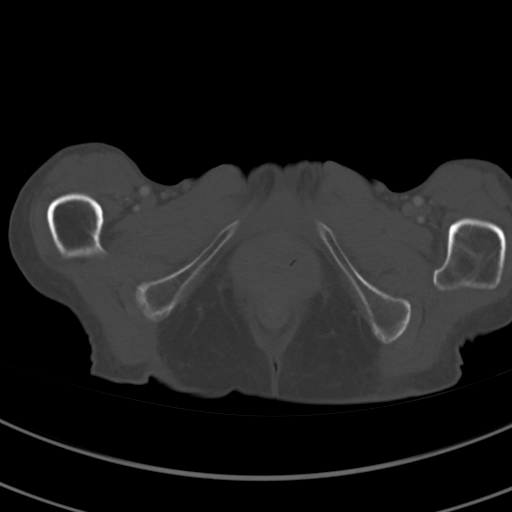
[im 14/79  soft-tissue]
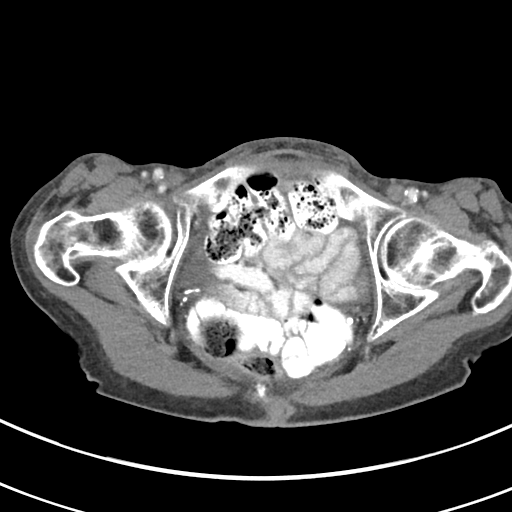
[im 18/79  soft-tissue]
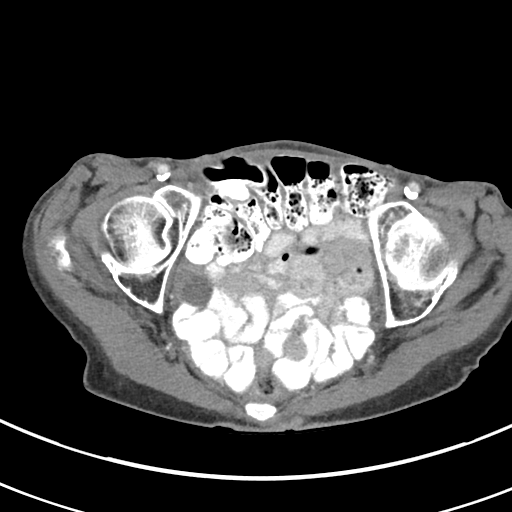
[im 22/79  soft-tissue]
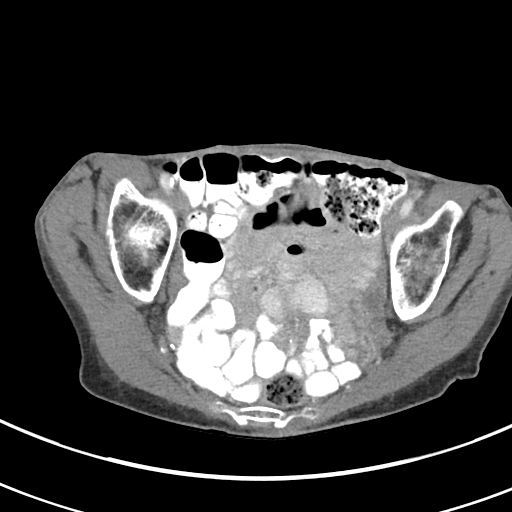
[im 31/79  soft-tissue]
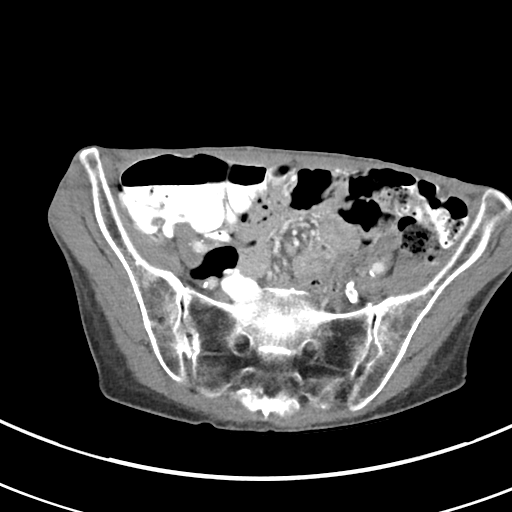
[im 35/79  soft-tissue]
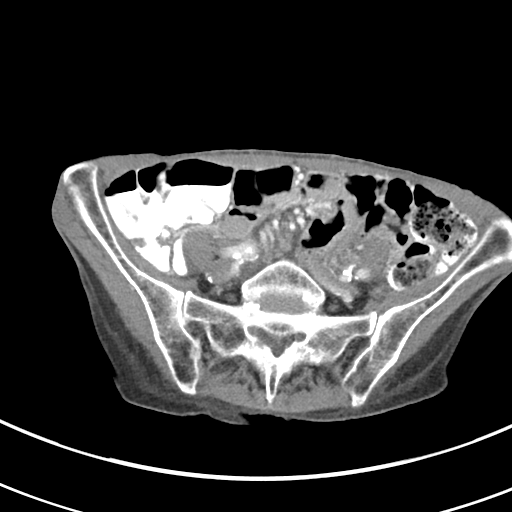
[im 44/79  soft-tissue]
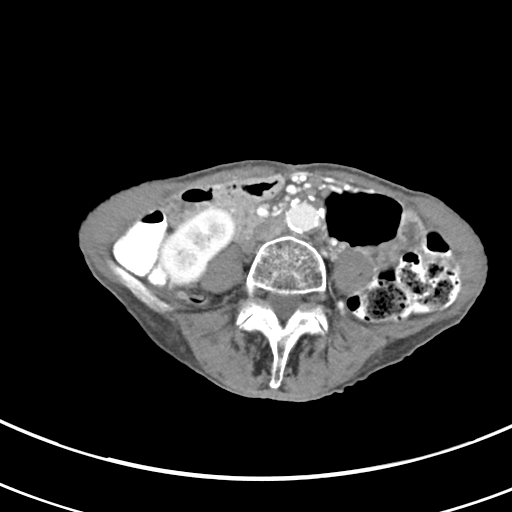
[im 48/79  soft-tissue]
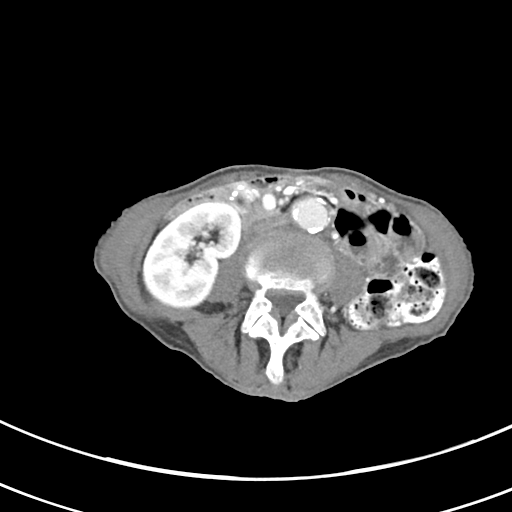
[im 57/79  soft-tissue]
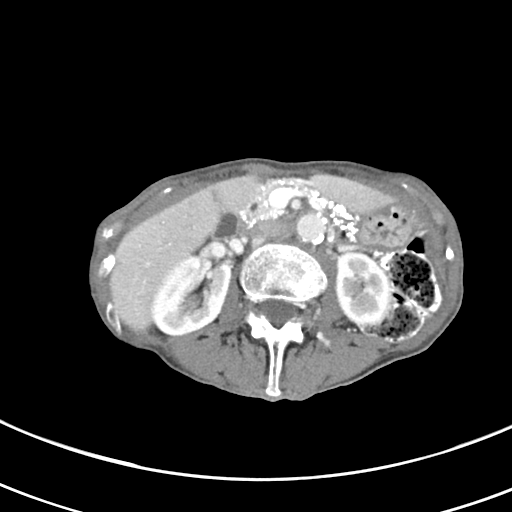
[im 57/79  bone]
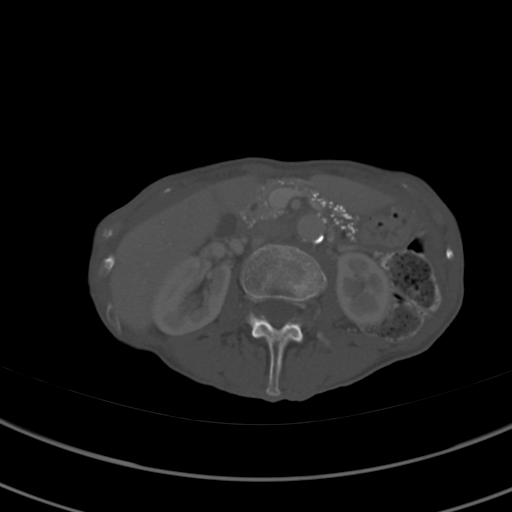
[im 61/79  soft-tissue]
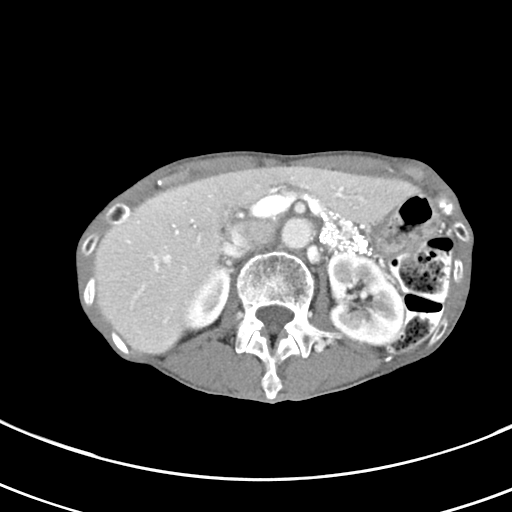
[im 66/79  soft-tissue]
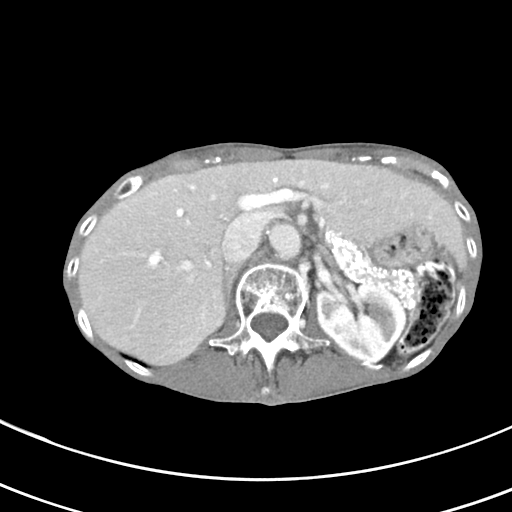
[im 74/79  soft-tissue]
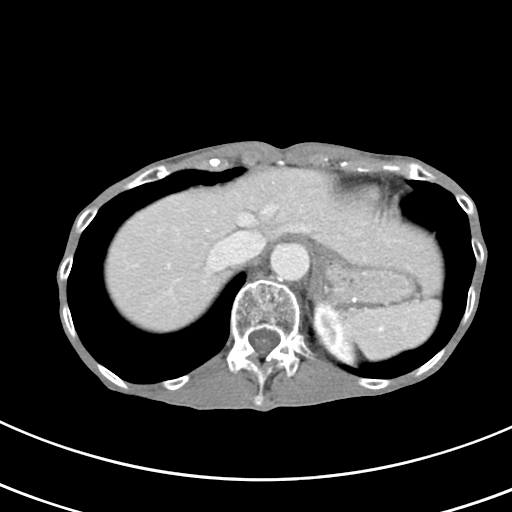

[Series 6: abd/pel w st · coronal · 0.60mm/px · 3 of 52 slices shown]
[im 18/52  soft-tissue]
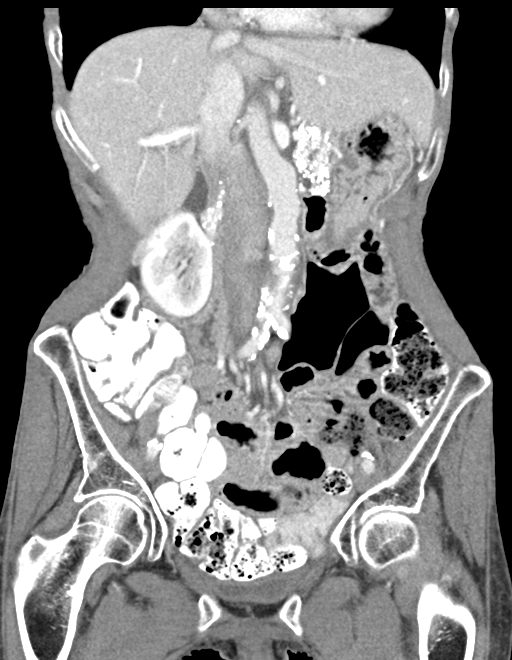
[im 23/52  soft-tissue]
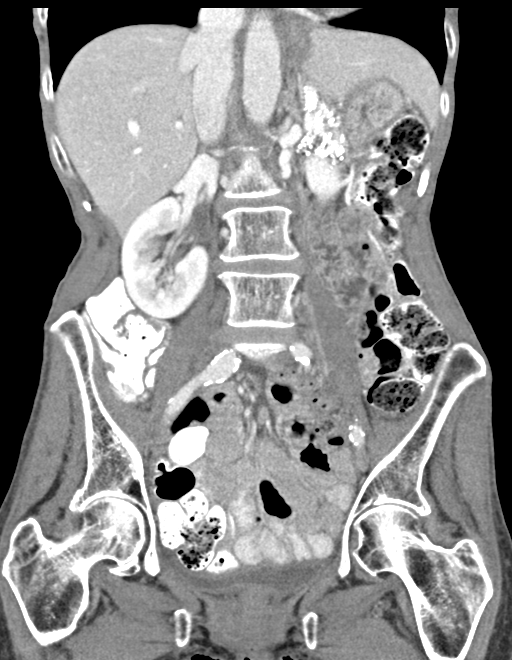
[im 29/52  soft-tissue]
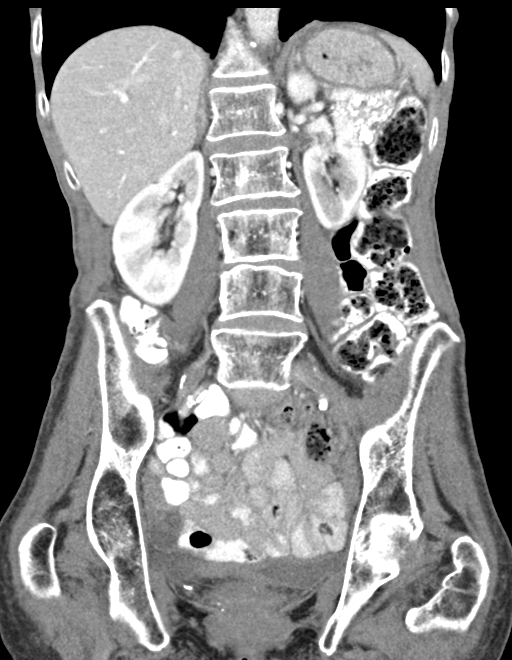

[15 of 46 positions shown; findings below may reference images not displayed]

FINDINGS: Lower Chest: Mild patchy airspace opacity is noted in the posterior
left lower lobe but is incompletely visualized. This may be due to
pneumonia or other inflammatory process. No evidence pleural
effusion.

Hepatobiliary:  No masses identified. Gallbladder is unremarkable.

Pancreas: Diffuse pancreatic calcification is seen, consistent with
chronic calcific pancreatitis. No evidence of pancreatic mass,
inflammatory changes, or peripancreatic fluid collections.

Spleen: Within normal limits in size and appearance.

Adrenals/Urinary Tract: No masses identified. No evidence of
hydronephrosis.

Stomach/Bowel: No evidence of obstruction, inflammatory process or
abnormal fluid collections.

Vascular/Lymphatic: No pathologically enlarged lymph nodes. Aortic
atherosclerosis. Ectasia of abdominal aorta is seen measuring
cm.

Reproductive: Prior hysterectomy. Cyst is seen in the right adnexa
measuring 2.3 cm which has probably benign characteristics. No other
pelvic mass or inflammatory process identified.

Other:  None.

Musculoskeletal:  No suspicious bone lesions identified.
IMPRESSION: Chronic calcific pancreatitis. No evidence of acute pancreatitis or
biliary ductal dilatation.

2.3 cm probably benign appearing right adnexal/ovarian cyst. In a
late postmenopausal female, transvaginal pelvic ultrasound is
recommended for further evaluation. This recommendation follows ACR
consensus guidelines: White Paper of the ACR Incidental Findings
Committee II on Adnexal Findings. [HOSPITAL] [DATE].

Ectatic abdominal aorta at risk for aneurysm development. Recommend
follow up by US in 5 years. This recommendation follows ACR
consensus guidelines: White Paper of the ACR Incidental Findings
Committee II on Vascular Findings. [HOSPITAL] [id number];
[DATE].

Incomplete visualization of mild patchy airspace disease in left
lower lobe. This is suspicious for pneumonia or other inflammatory
process. Consider chest CT or chest radiographic followup.

## 2017-10-11 ENCOUNTER — Other Ambulatory Visit: Payer: Self-pay | Admitting: *Deleted

## 2017-10-11 NOTE — Patient Outreach (Signed)
[facility] ([facility]) Care Management  10/11/2017  Rhonda Reynolds 08/29/1934 [id number]    RN spoke with pt today and verified identifiers along with review and discussion of the current plan of care. Pt indicates she is doing well with no acute symptoms or encounters. States she is eating well however continues to miss some meals but eating throughout the day with a reported weight of 83.8 lbs increased from the last reported weight of 82 lbs. Pt states she is trying and now more aware of the risk. Pt has also reported she is smoking less. States she patch di not work but she has been weaning it" on her own with no other assistance. RN introduced other products however pt with limited income and would prefer to do it "cut turkey".  Praised pt for her successful for the above achievements. Care plan discussed with all goals met and pt prepared for graduating from the [facility] program with no additional needs. RN inquired on any needs for the community as far as resources (declined). Based upon today's discussion pt is prepared to continue her ongoing management of care. Pt aware this RN and [facility] services remains available if needed in the future for any inquires or resources based upon her current coverage with [employer]. Pt aware RN will communicate her disposition with [facility] services with again no additional needs. Pt very appreciative and grateful for the [facility] services over the last few months indicating "you just don't know how much you mean to me". This case will be closed.  Although I have a verbal consent RN has requested CMA (Carliceia Davis) to sent a written consent to this pt for the record with contacts to note and provided information verifying the correct address for this pt. RN has also enlightened pt on the content and request for signature and which copies to keep and send back to the [facility] office with enclosed envelope.     Lisa Matthews, RN Care Management Coordinator [facility] Main Office [phone] 

## 2017-10-11 NOTE — Patient Outreach (Signed)
[facility]) Care Management  10/11/2017  ALYSSAMAE KLINCK 1934-08-15 [id number]    RN attempted to contact this pt however unsuccessful but able to leave a HIPAA approved voice message requesting a call back. Will awaiting a call back and will reschedule another follow up call.  Raina Mina, RN Care Management Coordinator [place] Office [phone]

## 2017-10-28 DIAGNOSIS — H04123 Dry eye syndrome of bilateral lacrimal glands: Secondary | ICD-10-CM | POA: Diagnosis not present

## 2017-10-28 DIAGNOSIS — M3501 Sicca syndrome with keratoconjunctivitis: Secondary | ICD-10-CM | POA: Diagnosis not present

## 2017-10-28 DIAGNOSIS — H35341 Macular cyst, hole, or pseudohole, right eye: Secondary | ICD-10-CM | POA: Diagnosis not present

## 2017-10-28 LAB — HM DIABETES EYE EXAM

## 2017-11-07 ENCOUNTER — Encounter: Payer: Self-pay | Admitting: Internal Medicine

## 2017-11-29 ENCOUNTER — Ambulatory Visit: Payer: Medicare Other | Admitting: Internal Medicine

## 2017-12-19 IMAGING — DX DG ANKLE COMPLETE 3+V*L*
3 series · 3 of 3 positions shown · non-contrast
Comparison: 10/29/2016 .

CLINICAL DATA: Left ankle fracture follow-up.

EXAM:
LEFT ANKLE COMPLETE - 3+ VIEW

[ankle ap]
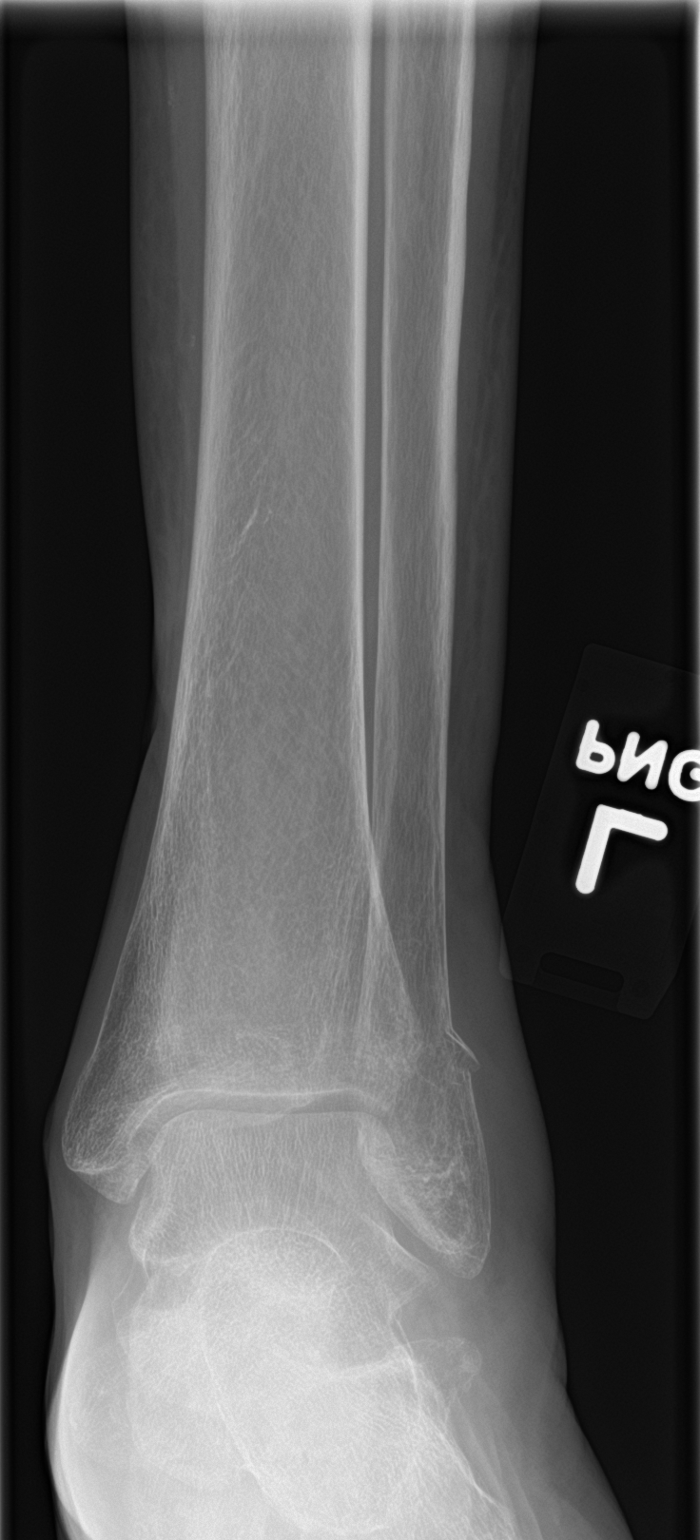

[ankle obl]
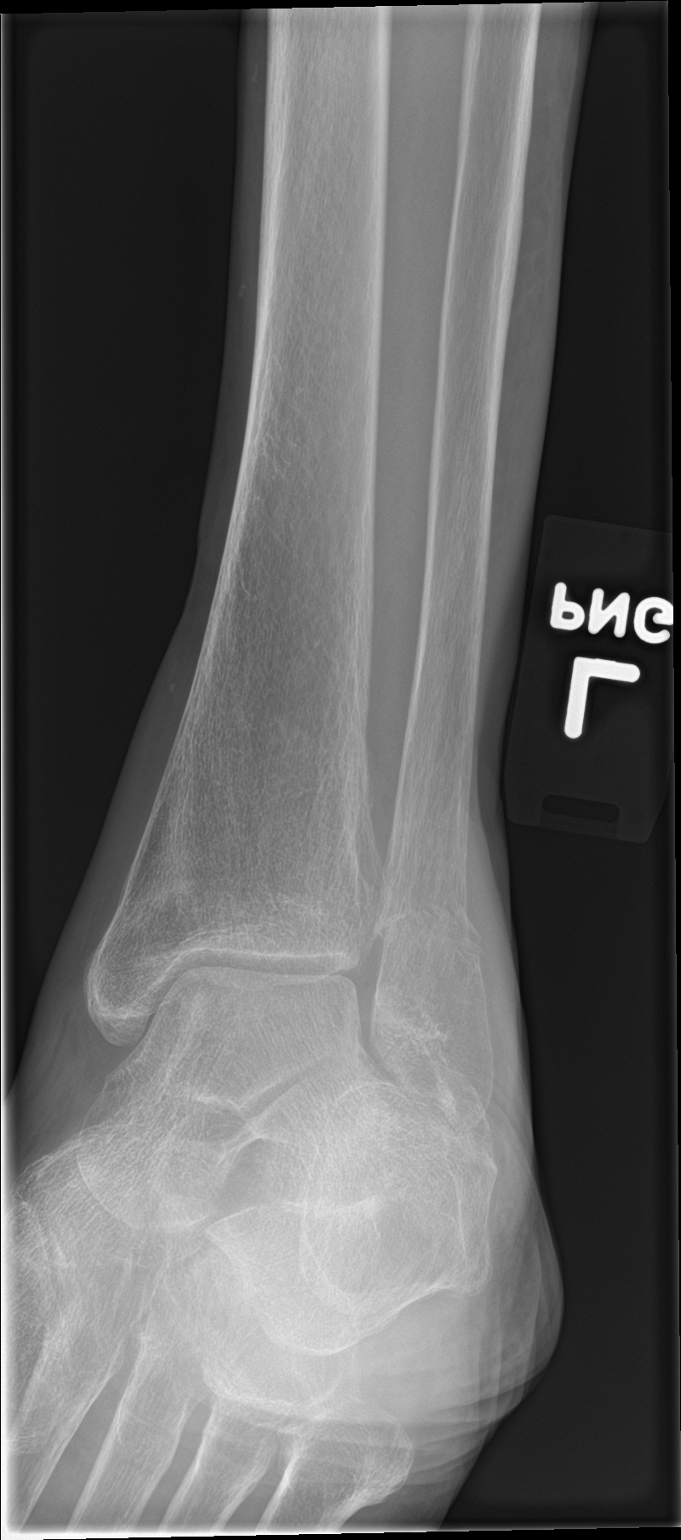

[ankle lat]
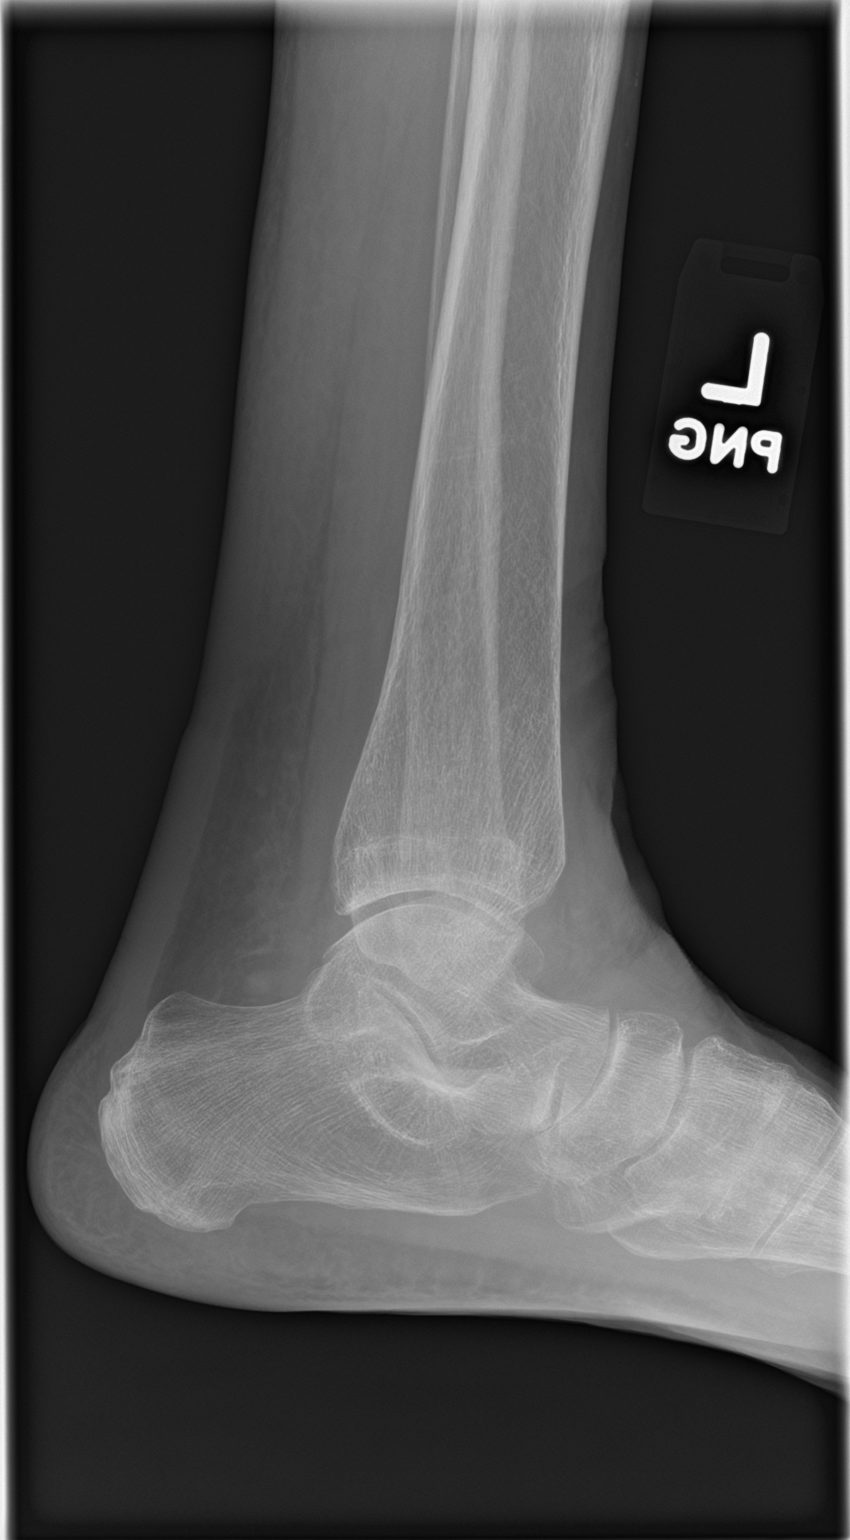

[3 of 3 positions shown; findings below may reference images not displayed]

FINDINGS: Diffuse soft tissue swelling is present. Comminuted distal left
fibular fracture again noted without interim change. No prominent
callus formation. No new abnormality identified.
IMPRESSION: Comminuted distal left fibular fracture again noted without interim
change. No prominent callus formation.

## 2017-12-30 ENCOUNTER — Ambulatory Visit (INDEPENDENT_AMBULATORY_CARE_PROVIDER_SITE_OTHER): Payer: Medicare Other | Admitting: *Deleted

## 2017-12-30 DIAGNOSIS — Z23 Encounter for immunization: Secondary | ICD-10-CM | POA: Diagnosis not present

## 2018-04-29 DIAGNOSIS — H26493 Other secondary cataract, bilateral: Secondary | ICD-10-CM | POA: Diagnosis not present

## 2018-04-29 DIAGNOSIS — R7303 Prediabetes: Secondary | ICD-10-CM | POA: Diagnosis not present

## 2018-04-29 DIAGNOSIS — H40023 Open angle with borderline findings, high risk, bilateral: Secondary | ICD-10-CM | POA: Diagnosis not present

## 2018-04-29 DIAGNOSIS — H35033 Hypertensive retinopathy, bilateral: Secondary | ICD-10-CM | POA: Diagnosis not present

## 2018-04-29 LAB — HM DIABETES EYE EXAM

## 2018-04-30 ENCOUNTER — Encounter: Payer: Self-pay | Admitting: Internal Medicine

## 2018-05-08 ENCOUNTER — Telehealth: Payer: Self-pay

## 2018-05-08 ENCOUNTER — Telehealth: Payer: Self-pay | Admitting: *Deleted

## 2018-05-08 NOTE — Telephone Encounter (Signed)
LVM for patient to call back in regards to scheduling AWV with our health coach.  

## 2018-05-08 NOTE — Telephone Encounter (Signed)
Pt. Sees Dr. Quay Burow, wanting to make AWV appointment. Routed to health coach at [place] to schedule per her needs.  Copied from [place] [id number]. Topic: Medicare AWV >> May 05, 2018 10:28 AM Berneta Levins wrote: Reason for CRM:   Beverlee Nims from [facility] calling:  States that pt needs to schedule her AWV. Pt can be reached at [phone]

## 2018-05-12 ENCOUNTER — Encounter: Payer: Self-pay | Admitting: Internal Medicine

## 2018-05-14 ENCOUNTER — Ambulatory Visit: Payer: Medicare Other

## 2018-05-14 NOTE — Progress Notes (Deleted)
Subjective:   Rhonda Reynolds is a 84 y.o. female who presents for Medicare Annual (Subsequent) preventive examination.  Review of Systems:  No ROS.  Medicare Wellness Visit. Additional risk factors are reflected in the social history.     Sleep patterns: {SX; SLEEP PATTERNS:18802::"feels rested on waking","does not get up to void","gets up *** times nightly to void","sleeps *** hours nightly"}.    Home Safety/Smoke Alarms: Feels safe in home. Smoke alarms in place.  Living environment; residence and Firearm Safety: {Rehab home environment / accessibility:30080::"no firearms","firearms stored safely"}. Seat Belt Safety/Bike Helmet: Wears seat belt.     Objective:     Vitals: There were no vitals taken for this visit.  There is no height or weight on file to calculate BMI.  Advanced Directives 05/29/2017 05/13/2017 03/02/2013  Does Patient Have a Medical Advance Directive? Yes Yes Patient has advance directive, copy not in chart  Type of Advance Directive [place];Living will [place];Living will Living will;Healthcare Power of [place] in Chart? No - copy requested No - copy requested -    Tobacco Social History   Tobacco Use  Smoking Status Current Every Day Smoker  . Packs/day: 0.50  . Years: 40.00  . Pack years: 20.00  . Types: Cigarettes  Smokeless Tobacco Never Used     Ready to quit: Not Answered Counseling given: Not Answered  Past Medical History:  Diagnosis Date  . Arthritis   . Breast cancer ([place])    left  . Diabetes mellitus without complication ([place])   . GERD (gastroesophageal reflux disease)    occ  . History of radiation therapy 04/15/13-05/12/13   42.5 gray to left breast. lumpectomy cavity to 50 gray  . Hyperlipidemia   . Hypertension   . Hyperthyroidism   . Osteoporosis   . Personal history of radiation therapy 2015   Past Surgical History:  Procedure Laterality Date    . ABDOMINAL HYSTERECTOMY    . BREAST LUMPECTOMY Left 03/04/2013  . BREAST LUMPECTOMY WITH NEEDLE LOCALIZATION Left 03/04/2013   Procedure: LEFT BREAST LUMPECTOMY WITH NEEDLE LOCALIZATION;  Surgeon: Imogene Burn. Georgette Dover, MD;  Location: [facility];  Service: General;  Laterality: Left;  . BREAST SURGERY    . EYE SURGERY Bilateral 16  . SPINE SURGERY  58   Family History  Problem Relation Age of Onset  . Cancer Mother        pt canot remember  . Diabetes Mother   . Stroke Mother   . Heart failure Father   . Lung cancer Brother    Social History   Socioeconomic History  . Marital status: Widowed    Spouse name: Not on file  . Number of children: 4  . Years of education: Not on file  . Highest education level: Not on file  Occupational History  . Not on file  Social Needs  . Financial resource strain: Not very hard  . Food insecurity:    Worry: Sometimes true    Inability: Sometimes true  . Transportation needs:    Medical: No    Non-medical: No  Tobacco Use  . Smoking status: Current Every Day Smoker    Packs/day: 0.50    Years: 40.00    Pack years: 20.00    Types: Cigarettes  . Smokeless tobacco: Never Used  Substance and Sexual Activity  . Alcohol use: No  . Drug use: No  . Sexual activity: Never  Lifestyle  .  Physical activity:    Days per week: 0 days    Minutes per session: 0 min  . Stress: Only a little  Relationships  . Social connections:    Talks on phone: More than three times a week    Gets together: More than three times a week    Attends religious service: More than 4 times per year    Active member of club or organization: Not on file    Attends meetings of clubs or organizations: More than 4 times per year    Relationship status: Widowed  Other Topics Concern  . Not on file  Social History Narrative   Widowed   Retired Control and instrumentation engineer    Outpatient Encounter Medications as of 05/14/2018  Medication Sig  . alendronate (FOSAMAX) 70 MG tablet  Take 1 tablet (70 mg total) by mouth once a week. Take with a full glass of water on an empty stomach.  Marland Kitchen aspirin EC 81 MG tablet Take 81 mg by mouth daily.  . carboxymethylcellulose (REFRESH PLUS) 0.5 % SOLN 1 drop 3 (three) times daily as needed (DRY EYE).   . cholecalciferol (VITAMIN D) 1000 units tablet Take 3 tablets (3,000 Units total) by mouth daily.  . cycloSPORINE (RESTASIS) 0.05 % ophthalmic emulsion Place 1 drop into both eyes 2 (two) times daily.  Marland Kitchen guaiFENesin (MUCINEX) 600 MG 12 hr tablet Take by mouth.  . tiotropium (SPIRIVA HANDIHALER) 18 MCG inhalation capsule    No facility-administered encounter medications on file as of 05/14/2018.     Activities of Daily Living In your present state of health, do you have any difficulty performing the following activities: 05/29/2017  Hearing? N  Vision? N  Difficulty concentrating or making decisions? N  Walking or climbing stairs? N  Dressing or bathing? N  Doing errands, shopping? N  Preparing Food and eating ? N  Using the Toilet? N  In the past six months, have you accidently leaked urine? N  Do you have problems with loss of bowel control? N  Managing your Medications? N  Managing your Finances? N  Housekeeping or managing your Housekeeping? N  Some recent data might be hidden    Patient Care Team: Binnie Rail, MD as PCP - General (Internal Medicine)    Assessment:   This is a routine wellness examination for Rhonda Reynolds. Physical assessment deferred to PCP.   Exercise Activities and Dietary recommendations   Diet (meal preparation, eat out, water intake, caffeinated beverages, dairy products, fruits and vegetables): {Desc; diets:16563}   Goals    . Patient Stated     Stay as healthy and as independent as possible by monitoring my diet for sugar and carbohydrates, increasing my physical activity by looking into the [facility], and drink more water.        Fall Risk Fall Risk  05/29/2017 05/13/2017 01/18/2017  12/19/2015  Falls in the past year? Yes Yes Yes No  Number falls in past yr: 2 or more 1 1 -  Injury with Fall? Yes Yes Yes -  Risk for fall due to : Impaired balance/gait - - -  Follow up Falls prevention discussed Falls prevention discussed - -    Depression Screen PHQ 2/9 Scores 05/29/2017 05/13/2017 01/18/2017 12/19/2015  PHQ - 2 Score 0 0 0 0  PHQ- 9 Score - 3 - -     Cognitive Function MMSE - Mini Mental State Exam 05/13/2017  Orientation to time 5  Orientation to Place 5  Registration  3  Attention/ Calculation 5  Recall 1  Language- name 2 objects 2  Language- repeat 1  Language- follow 3 step command 3  Language- read & follow direction 1  Write a sentence 1  Copy design 1  Total score 28        Immunization History  Administered Date(s) Administered  . Influenza, High Dose Seasonal PF 12/19/2015, 01/09/2017, 12/30/2017  . Pneumococcal Conjugate-13 02/02/2014  . Pneumococcal Polysaccharide-23 12/10/2001  . Tdap 04/20/2013   Screening Tests Health Maintenance  Topic Date Due  . DEXA SCAN  04/29/2011  . FOOT EXAM  05/13/2018  . URINE MICROALBUMIN  05/31/2018  . OPHTHALMOLOGY EXAM  04/30/2019  . TETANUS/TDAP  04/21/2023  . INFLUENZA VACCINE  Completed  . PNA vac Low Risk Adult  Completed      Plan:    I have personally reviewed and noted the following in the patient's chart:   . Medical and social history . Use of alcohol, tobacco or illicit drugs  . Current medications and supplements . Functional ability and status . Nutritional status . Physical activity . Advanced directives . List of other physicians . Vitals . Screenings to include cognitive, depression, and falls . Referrals and appointments  In addition, I have reviewed and discussed with patient certain preventive protocols, quality metrics, and best practice recommendations. A written personalized care plan for preventive services as well as general preventive health recommendations were  provided to patient.     Michiel Cowboy, RN  05/14/2018

## 2018-05-26 ENCOUNTER — Other Ambulatory Visit: Payer: Self-pay | Admitting: Internal Medicine

## 2018-06-03 ENCOUNTER — Other Ambulatory Visit: Payer: Self-pay | Admitting: Internal Medicine

## 2018-07-10 ENCOUNTER — Telehealth: Payer: Self-pay | Admitting: Internal Medicine

## 2018-07-10 ENCOUNTER — Other Ambulatory Visit: Payer: Self-pay | Admitting: Internal Medicine

## 2018-07-10 MED ORDER — TIOTROPIUM BROMIDE MONOHYDRATE 18 MCG IN CAPS: ORAL_CAPSULE | RESPIRATORY_TRACT | 1 refills | Status: DC

## 2018-07-10 NOTE — Telephone Encounter (Signed)
Appt scheduled

## 2018-07-10 NOTE — Telephone Encounter (Signed)
Patient stated to call her at number [phone] in order for her to be able to make a appointment

## 2018-07-10 NOTE — Telephone Encounter (Signed)
Pt is due for annual appt. Will send 2 month supply to [facility]. Pls contact pt to make yearly exam../lmb

## 2018-07-10 NOTE — Telephone Encounter (Signed)
Refill request for tiotropium; last office visit 06/11/17; no upcoming visits noted; last refill 06/03/2018; contacted pt and made her aware; she verbalizes understanding, and would like to make an appointment; unable to contact [facility] flow coordinator by phone x 3; will route to office so that pt can be contacted and scheduled; the pt can be contacted at [phone]; pt normally seen by Dr Billey Gosling, [facility].  Requested Prescriptions  Pending Prescriptions Disp Refills  . tiotropium (SPIRIVA HANDIHALER) 18 MCG inhalation capsule 30 capsule 0    Sig: Inhale the contents of one capsule in the handihaler once daily. Need office visit for more refills.     Pulmonology:  Anticholinergic Agents Failed - 07/10/2018  9:55 AM      Failed - Valid encounter within last 12 months    Recent Outpatient Visits          1 year ago Preventative health care   [place], Claudina Lick, MD   1 year ago Closed nondisplaced fracture of lateral malleolus of left fibula with routine healing, subsequent encounter   [place], Enid Baas, MD   1 year ago Closed nondisplaced fracture of lateral malleolus of left fibula with routine healing, subsequent encounter   [place], Enid Baas, MD   1 year ago Closed nondisplaced fracture of lateral malleolus of left fibula with routine healing, subsequent encounter   [place], Enid Baas, MD   1 year ago Closed nondisplaced fracture of lateral malleolus of left fibula with routine healing, subsequent encounter   [facility], Enid Baas, MD

## 2018-07-10 NOTE — Telephone Encounter (Signed)
Appointment has been made for this patient by Sam.

## 2018-07-11 ENCOUNTER — Other Ambulatory Visit: Payer: Self-pay | Admitting: Internal Medicine

## 2018-07-11 NOTE — Telephone Encounter (Signed)
Per office policy sent refill to local pharmacy until appt...Rhonda Reynolds

## 2018-07-22 ENCOUNTER — Other Ambulatory Visit: Payer: Self-pay | Admitting: Internal Medicine

## 2018-07-22 DIAGNOSIS — Z853 Personal history of malignant neoplasm of breast: Secondary | ICD-10-CM

## 2018-08-27 ENCOUNTER — Other Ambulatory Visit: Payer: Self-pay

## 2018-08-27 ENCOUNTER — Ambulatory Visit
Admission: RE | Admit: 2018-08-27 | Discharge: 2018-08-27 | Disposition: A | Discharge status: Home / Self Care | Payer: Medicare Other | Source: Ambulatory Visit | Attending: Internal Medicine | Admitting: Internal Medicine

## 2018-08-27 DIAGNOSIS — Z853 Personal history of malignant neoplasm of breast: Secondary | ICD-10-CM

## 2018-08-27 DIAGNOSIS — R922 Inconclusive mammogram: Secondary | ICD-10-CM | POA: Diagnosis not present

## 2018-09-16 ENCOUNTER — Encounter: Payer: Self-pay | Admitting: Internal Medicine

## 2018-09-16 NOTE — Patient Instructions (Addendum)
You should have an ultrasound done of your ovaries to follow up on your right ovary cyst.  You should consider have the shingles vaccine - this can be done at the pharmacy.     Tests ordered today. Your results will be released to [facility] (or called to you) after review, usually within 72hours after test completion. If any changes need to be made, you will be notified at that same time.  All other Health Maintenance issues reviewed.   All recommended immunizations and age-appropriate screenings are up-to-date or discussed.  No immunizations administered today.   Medications reviewed and updated.  Changes include :   Start taking calcium 600 mg twice a day.      Please followup in 6      Health Maintenance, Female Adopting a healthy lifestyle and getting preventive care can go a long way to promote health and wellness. Talk with your health care provider about what schedule of regular examinations is right for you. This is a good chance for you to check in with your provider about disease prevention and staying healthy. In between checkups, there are plenty of things you can do on your own. Experts have done a lot of research about which lifestyle changes and preventive measures are most likely to keep you healthy. Ask your health care provider for more information. Weight and diet Eat a healthy diet  Be sure to include plenty of vegetables, fruits, low-fat dairy products, and lean protein.  Do not eat a lot of foods high in solid fats, added sugars, or salt.  Get regular exercise. This is one of the most important things you can do for your health. ? Most adults should exercise for at least 150 minutes each week. The exercise should increase your heart rate and make you sweat (moderate-intensity exercise). ? Most adults should also do strengthening exercises at least twice a week. This is in addition to the moderate-intensity exercise. Maintain a healthy weight  Body mass index  (BMI) is a measurement that can be used to identify possible weight problems. It estimates body fat based on height and weight. Your health care provider can help determine your BMI and help you achieve or maintain a healthy weight.  For females 74 years of age and older: ? A BMI below 18.5 is considered underweight. ? A BMI of 18.5 to 24.9 is normal. ? A BMI of 25 to 29.9 is considered overweight. ? A BMI of 30 and above is considered obese. Watch levels of cholesterol and blood lipids  You should start having your blood tested for lipids and cholesterol at 82 years of age, then have this test every 5 years.  You may need to have your cholesterol levels checked more often if: ? Your lipid or cholesterol levels are high. ? You are older than 82 years of age. ? You are at high risk for heart disease. Cancer screening Lung Cancer  Lung cancer screening is recommended for adults 28-58 years old who are at high risk for lung cancer because of a history of smoking.  A yearly low-dose CT scan of the lungs is recommended for people who: ? Currently smoke. ? Have quit within the past 15 years. ? Have at least a 30-pack-year history of smoking. A pack year is smoking an average of one pack of cigarettes a day for 1 year.  Yearly screening should continue until it has been 15 years since you quit.  Yearly screening should stop if you develop  a health problem that would prevent you from having lung cancer treatment. Breast Cancer  Practice breast self-awareness. This means understanding how your breasts normally appear and feel.  It also means doing regular breast self-exams. Let your health care provider know about any changes, no matter how small.  If you are in your 20s or 30s, you should have a clinical breast exam (CBE) by a health care provider every 1-3 years as part of a regular health exam.  If you are 69 or older, have a CBE every year. Also consider having a breast X-ray  (mammogram) every year.  If you have a family history of breast cancer, talk to your health care provider about genetic screening.  If you are at high risk for breast cancer, talk to your health care provider about having an MRI and a mammogram every year.  Breast cancer gene (BRCA) assessment is recommended for women who have family members with BRCA-related cancers. BRCA-related cancers include: ? Breast. ? Ovarian. ? Tubal. ? Peritoneal cancers.  Results of the assessment will determine the need for genetic counseling and BRCA1 and BRCA2 testing. Cervical Cancer Your health care provider may recommend that you be screened regularly for cancer of the pelvic organs (ovaries, uterus, and vagina). This screening involves a pelvic examination, including checking for microscopic changes to the surface of your cervix (Pap test). You may be encouraged to have this screening done every 3 years, beginning at age 64.  For women ages 76-65, health care providers may recommend pelvic exams and Pap testing every 3 years, or they may recommend the Pap and pelvic exam, combined with testing for human papilloma virus (HPV), every 5 years. Some types of HPV increase your risk of cervical cancer. Testing for HPV may also be done on women of any age with unclear Pap test results.  Other health care providers may not recommend any screening for nonpregnant women who are considered low risk for pelvic cancer and who do not have symptoms. Ask your health care provider if a screening pelvic exam is right for you.  If you have had past treatment for cervical cancer or a condition that could lead to cancer, you need Pap tests and screening for cancer for at least 20 years after your treatment. If Pap tests have been discontinued, your risk factors (such as having a new sexual partner) need to be reassessed to determine if screening should resume. Some women have medical problems that increase the chance of getting  cervical cancer. In these cases, your health care provider may recommend more frequent screening and Pap tests. Colorectal Cancer  This type of cancer can be detected and often prevented.  Routine colorectal cancer screening usually begins at 82 years of age and continues through 82 years of age.  Your health care provider may recommend screening at an earlier age if you have risk factors for colon cancer.  Your health care provider may also recommend using home test kits to check for hidden blood in the stool.  A small camera at the end of a tube can be used to examine your colon directly (sigmoidoscopy or colonoscopy). This is done to check for the earliest forms of colorectal cancer.  Routine screening usually begins at age 78.  Direct examination of the colon should be repeated every 5-10 years through 82 years of age. However, you may need to be screened more often if early forms of precancerous polyps or small growths are found. Skin Cancer  Check  your skin from head to toe regularly.  Tell your health care provider about any new moles or changes in moles, especially if there is a change in a mole's shape or color.  Also tell your health care provider if you have a mole that is larger than the size of a pencil eraser.  Always use sunscreen. Apply sunscreen liberally and repeatedly throughout the day.  Protect yourself by wearing long sleeves, pants, a wide-brimmed hat, and sunglasses whenever you are outside. Heart disease, diabetes, and high blood pressure  High blood pressure causes heart disease and increases the risk of stroke. High blood pressure is more likely to develop in: ? People who have blood pressure in the high end of the normal range (130-139/85-89 mm Hg). ? People who are overweight or obese. ? People who are African American.  If you are 61-24 years of age, have your blood pressure checked every 3-5 years. If you are 58 years of age or older, have your blood  pressure checked every year. You should have your blood pressure measured twice-once when you are at a hospital or clinic, and once when you are not at a hospital or clinic. Record the average of the two measurements. To check your blood pressure when you are not at a hospital or clinic, you can use: ? An automated blood pressure machine at a pharmacy. ? A home blood pressure monitor.  If you are between 65 years and 69 years old, ask your health care provider if you should take aspirin to prevent strokes.  Have regular diabetes screenings. This involves taking a blood sample to check your fasting blood sugar level. ? If you are at a normal weight and have a low risk for diabetes, have this test once every three years after 82 years of age. ? If you are overweight and have a high risk for diabetes, consider being tested at a younger age or more often. Preventing infection Hepatitis B  If you have a higher risk for hepatitis B, you should be screened for this virus. You are considered at high risk for hepatitis B if: ? You were born in a country where hepatitis B is common. Ask your health care provider which countries are considered high risk. ? Your parents were born in a high-risk country, and you have not been immunized against hepatitis B (hepatitis B vaccine). ? You have HIV or AIDS. ? You use needles to inject street drugs. ? You live with someone who has hepatitis B. ? You have had sex with someone who has hepatitis B. ? You get hemodialysis treatment. ? You take certain medicines for conditions, including cancer, organ transplantation, and autoimmune conditions. Hepatitis C  Blood testing is recommended for: ? Everyone born from [birth year] through 1963. ? Anyone with known risk factors for hepatitis C. Sexually transmitted infections (STIs)  You should be screened for sexually transmitted infections (STIs) including gonorrhea and chlamydia if: ? You are sexually active and are younger  than 82 years of age. ? You are older than 82 years of age and your health care provider tells you that you are at risk for this type of infection. ? Your sexual activity has changed since you were last screened and you are at an increased risk for chlamydia or gonorrhea. Ask your health care provider if you are at risk.  If you do not have HIV, but are at risk, it may be recommended that you take a prescription medicine daily to prevent  HIV infection. This is called pre-exposure prophylaxis (PrEP). You are considered at risk if: ? You are sexually active and do not regularly use condoms or know the HIV status of your partner(s). ? You take drugs by injection. ? You are sexually active with a partner who has HIV. Talk with your health care provider about whether you are at high risk of being infected with HIV. If you choose to begin PrEP, you should first be tested for HIV. You should then be tested every 3 months for as long as you are taking PrEP. Pregnancy  If you are premenopausal and you may become pregnant, ask your health care provider about preconception counseling.  If you may become pregnant, take 400 to 800 micrograms (mcg) of folic acid every day.  If you want to prevent pregnancy, talk to your health care provider about birth control (contraception). Osteoporosis and menopause  Osteoporosis is a disease in which the bones lose minerals and strength with aging. This can result in serious bone fractures. Your risk for osteoporosis can be identified using a bone density scan.  If you are 49 years of age or older, or if you are at risk for osteoporosis and fractures, ask your health care provider if you should be screened.  Ask your health care provider whether you should take a calcium or vitamin D supplement to lower your risk for osteoporosis.  Menopause may have certain physical symptoms and risks.  Hormone replacement therapy may reduce some of these symptoms and risks. Talk  to your health care provider about whether hormone replacement therapy is right for you. Follow these instructions at home:  Schedule regular health, dental, and eye exams.  Stay current with your immunizations.  Do not use any tobacco products including cigarettes, chewing tobacco, or electronic cigarettes.  If you are pregnant, do not drink alcohol.  If you are breastfeeding, limit how much and how often you drink alcohol.  Limit alcohol intake to no more than 1 drink per day for nonpregnant women. One drink equals 12 ounces of beer, 5 ounces of wine, or 1 ounces of hard liquor.  Do not use street drugs.  Do not share needles.  Ask your health care provider for help if you need support or information about quitting drugs.  Tell your health care provider if you often feel depressed.  Tell your health care provider if you have ever been abused or do not feel safe at home. This information is not intended to replace advice given to you by your health care provider. Make sure you discuss any questions you have with your health care provider. Document Released: 09/25/2010 Document Revised: 08/18/2015 Document Reviewed: 12/14/2014 Elsevier Interactive Patient Education  2019 [name].

## 2018-09-16 NOTE — Progress Notes (Signed)
Subjective:    Patient ID: Rhonda Reynolds, female    DOB: [birth year], 83 y.o.   MRN: [id number]  HPI She is here for a physical exam.   She denies any changes since she was here last and has no concerns.  Medications and allergies reviewed with patient and updated if appropriate.  Patient Active Problem List   Diagnosis Date Noted  . Chronic pancreatitis ([place]) 01/09/2017  . Abnormal CT of the chest 10/04/2016  . Ovarian cyst 10/04/2016  . Tortuous aorta ([place]) 08/30/2016  . Abdominal pain 08/09/2016  . Diabetes mellitus without complication ([place]) 43/56/8616  . Protein-calorie malnutrition ([place]) 12/19/2015  . Sensorineural hearing loss of both ears 12/19/2015  . Weight loss 12/19/2015  . Tobacco use disorder 12/19/2015  . History of hyperthyroidism 12/19/2015  . Essential hypertension 12/18/2015  . PAD (peripheral artery disease) ([place]) 12/18/2015  . Osteoporosis 12/18/2015  . History of left breast cancer 12/18/2015  . Neoplasm of left breast, primary tumor staging category Tis: ductal carcinoma in situ (DCIS) 03/25/2013    Current Outpatient Medications on File Prior to Visit  Medication Sig Dispense Refill  . aspirin EC 81 MG tablet Take 81 mg by mouth daily.    . carboxymethylcellulose (REFRESH PLUS) 0.5 % SOLN 1 drop 3 (three) times daily as needed (DRY EYE).     . cholecalciferol (VITAMIN D) 1000 units tablet Take 3 tablets (3,000 Units total) by mouth daily. 90 tablet 1  . cycloSPORINE (RESTASIS) 0.05 % ophthalmic emulsion Place 1 drop into both eyes 2 (two) times daily. 0.4 mL 5  . guaiFENesin (MUCINEX) 600 MG 12 hr tablet Take by mouth.    . tiotropium (SPIRIVA HANDIHALER) 18 MCG inhalation capsule INHALE THE CONTENTS OF 1 CAPSULE VIA INHALATION DEVICE EVERY DAY 30 capsule 1   No current facility-administered medications on file prior to visit.     Past Medical History:  Diagnosis Date  . Arthritis   . Breast cancer ([place])    left  . Diabetes mellitus  without complication ([place])   . GERD (gastroesophageal reflux disease)    occ  . History of radiation therapy 04/15/13-05/12/13   42.5 gray to left breast. lumpectomy cavity to 50 gray  . Hyperlipidemia   . Hypertension   . Hyperthyroidism   . Left fibular fracture 10/29/2016  . Osteoporosis   . Personal history of radiation therapy 2015    Past Surgical History:  Procedure Laterality Date  . ABDOMINAL HYSTERECTOMY    . BREAST LUMPECTOMY Left 03/04/2013  . BREAST LUMPECTOMY WITH NEEDLE LOCALIZATION Left 03/04/2013   Procedure: LEFT BREAST LUMPECTOMY WITH NEEDLE LOCALIZATION;  Surgeon: Imogene Burn. Georgette Dover, MD;  Location: [facility];  Service: General;  Laterality: Left;  . BREAST SURGERY    . EYE SURGERY Bilateral 14  . SPINE SURGERY  72    Social History   Socioeconomic History  . Marital status: Widowed    Spouse name: Not on file  . Number of children: 4  . Years of education: Not on file  . Highest education level: Not on file  Occupational History  . Not on file  Social Needs  . Financial resource strain: Not very hard  . Food insecurity    Worry: Sometimes true    Inability: Sometimes true  . Transportation needs    Medical: No    Non-medical: No  Tobacco Use  . Smoking status: Current Every Day Smoker    Packs/day: 0.50    Years: 40.00  Pack years: 20.00    Types: Cigarettes  . Smokeless tobacco: Never Used  Substance and Sexual Activity  . Alcohol use: No  . Drug use: No  . Sexual activity: Never  Lifestyle  . Physical activity    Days per week: 0 days    Minutes per session: 0 min  . Stress: Only a little  Relationships  . Social connections    Talks on phone: More than three times a week    Gets together: More than three times a week    Attends religious service: More than 4 times per year    Active member of club or organization: Not on file    Attends meetings of clubs or organizations: More than 4 times per year    Relationship status: Widowed  Other  Topics Concern  . Not on file  Social History Narrative   Widowed   Retired Control and instrumentation engineer    Family History  Problem Relation Age of Onset  . Cancer Mother        pt canot remember  . Diabetes Mother   . Stroke Mother   . Heart failure Father   . Lung cancer Brother     Review of Systems  Constitutional: Negative for chills and fever.  Eyes: Negative for visual disturbance.  Respiratory: Positive for cough (from smoking, productive at times). Negative for shortness of breath and wheezing.   Cardiovascular: Negative for chest pain, palpitations and leg swelling.  Gastrointestinal: Negative for abdominal pain, blood in stool, constipation, diarrhea and nausea.  Genitourinary: Negative for dysuria and hematuria.  Musculoskeletal: Positive for arthralgias (when she sits too much).  Skin: Negative for color change and rash.  Neurological: Negative for light-headedness and headaches.  Psychiatric/Behavioral: Negative for dysphoric mood. The patient is not nervous/anxious.        Objective:   Vitals:   09/17/18 0850  BP: (!) 174/102  Pulse: (!) 112  Resp: 18  Temp: 98.2 F (36.8 C)  SpO2: 99%   Filed Weights   09/17/18 0850  Weight: 87 lb (39.5 kg)   Body mass index is 14.04 kg/m.  BP Readings from Last 3 Encounters:  09/17/18 (!) 174/102  09/13/17 134/82  07/18/17 120/64    Wt Readings from Last 3 Encounters:  09/17/18 87 lb (39.5 kg)  09/13/17 84 lb 6.4 oz (38.3 kg)  07/18/17 88 lb (39.9 kg)     Physical Exam Constitutional: She is thin and slightly cachectic. No distress.  HENT:  Head: Normocephalic and atraumatic.  Right Ear: External ear normal. Normal ear canal and TM Left Ear: External ear normal.  Normal ear canal and TM Mouth/Throat: Oropharynx is clear and moist.  Eyes: Conjunctivae and EOM are normal.  Neck: Neck supple. No tracheal deviation present. No thyromegaly present. No carotid bruit  Cardiovascular: Normal rate, regular rhythm  and normal heart sounds.   No murmur heard.  No edema. Pulmonary/Chest: Effort normal and breath sounds normal. No respiratory distress. She has no wheezes. She has no rales.  Breast: deferred   Abdominal: Soft. She exhibits no distension. There is no tenderness.  Lymphadenopathy: She has no cervical adenopathy.  Skin: Skin is warm and dry. She is not diaphoretic.  Psychiatric: She has a normal mood and affect. Her behavior is normal.   Diabetic Foot Exam - Simple   Simple Foot Form Diabetic Foot exam was performed with the following findings: Yes 09/17/2018  9:35 AM  Visual Inspection No deformities, no ulcerations, no other  skin breakdown bilaterally: Yes Sensation Testing Intact to touch and monofilament testing bilaterally: Yes Pulse Check Posterior Tibialis and Dorsalis pulse intact bilaterally: Yes Comments         Assessment & Plan:   Physical exam: Screening blood work ordered Immunizations  Discussed shingrix, others up to date Colonoscopy   No longer needed due to age 34   Up to date  Gyn   n/a Dexa   Due  Eye exams   Up to date  Exercise   None, does housework Weight - weight stable- struggles to gain weight - encouraged to maintain good nutrition Skin  No concerns Substance abuse    Smoking - does not to quit  See Problem List for Assessment and Plan of chronic medical problems.

## 2018-09-17 ENCOUNTER — Telehealth: Payer: Self-pay

## 2018-09-17 ENCOUNTER — Encounter: Payer: Self-pay | Admitting: Internal Medicine

## 2018-09-17 ENCOUNTER — Other Ambulatory Visit: Payer: Self-pay

## 2018-09-17 ENCOUNTER — Ambulatory Visit (INDEPENDENT_AMBULATORY_CARE_PROVIDER_SITE_OTHER): Payer: Medicare Other | Admitting: Internal Medicine

## 2018-09-17 VITALS — BP 174/102 | HR 112 | Temp 98.2°F | Resp 18 | Ht 66.0 in | Wt 87.0 lb

## 2018-09-17 DIAGNOSIS — Z0000 Encounter for general adult medical examination without abnormal findings: Secondary | ICD-10-CM

## 2018-09-17 DIAGNOSIS — Z8639 Personal history of other endocrine, nutritional and metabolic disease: Secondary | ICD-10-CM

## 2018-09-17 DIAGNOSIS — I10 Essential (primary) hypertension: Secondary | ICD-10-CM | POA: Diagnosis not present

## 2018-09-17 DIAGNOSIS — E119 Type 2 diabetes mellitus without complications: Secondary | ICD-10-CM

## 2018-09-17 DIAGNOSIS — F17200 Nicotine dependence, unspecified, uncomplicated: Secondary | ICD-10-CM

## 2018-09-17 DIAGNOSIS — M810 Age-related osteoporosis without current pathological fracture: Secondary | ICD-10-CM | POA: Diagnosis not present

## 2018-09-17 DIAGNOSIS — N83201 Unspecified ovarian cyst, right side: Secondary | ICD-10-CM

## 2018-09-17 DIAGNOSIS — Z853 Personal history of malignant neoplasm of breast: Secondary | ICD-10-CM

## 2018-09-17 NOTE — Assessment & Plan Note (Signed)
A1c, urine microalbumin Diet controlled Not always careful with low sugar/carbohydrate diet, but I am afraid to restrict her diet too much because of her weight Sedentary Encouraged good nutrition-increase protein intake and caloric intake

## 2018-09-17 NOTE — Telephone Encounter (Signed)
Patient's insurance will be verified for prolia injections, I will call patient to discuss summary of benefits

## 2018-09-17 NOTE — Telephone Encounter (Signed)
-----   Message from Binnie Rail, MD sent at 09/17/2018  9:16 AM EDT ----- She is a good candidate for prolia - can you look into cost

## 2018-09-17 NOTE — Assessment & Plan Note (Signed)
TSH 

## 2018-09-17 NOTE — Assessment & Plan Note (Signed)
Pelvic/transvaginal ultrasound ordered for follow-up

## 2018-09-17 NOTE — Assessment & Plan Note (Addendum)
Does not want to quit, but understands the reasons that she should quit

## 2018-09-17 NOTE — Assessment & Plan Note (Signed)
Mammogram up-to-date No evidence of recurrence 

## 2018-09-17 NOTE — Assessment & Plan Note (Addendum)
BP elevated here today, but typically not elevated Possibly related to deconditioning and anxiety about coming today Given her weight the measurement may not be very accurate as well Will not change medication-currently not on any medication and has been fine without medication Ideally should monitor blood pressure

## 2018-09-17 NOTE — Assessment & Plan Note (Addendum)
Did not tolerate fosamax - GERD Will have dexa Taking vitamin d, advised starting calcium twice daily Will look into prolia Does house work -- no other exercise

## 2018-09-22 ENCOUNTER — Other Ambulatory Visit: Payer: Self-pay | Admitting: Surgery

## 2018-09-22 DIAGNOSIS — N644 Mastodynia: Secondary | ICD-10-CM

## 2018-09-22 DIAGNOSIS — R922 Inconclusive mammogram: Secondary | ICD-10-CM

## 2018-09-22 DIAGNOSIS — D0512 Intraductal carcinoma in situ of left breast: Secondary | ICD-10-CM

## 2018-10-03 ENCOUNTER — Ambulatory Visit
Admission: RE | Admit: 2018-10-03 | Discharge: 2018-10-03 | Disposition: A | Discharge status: Home / Self Care | Payer: Medicare Other | Source: Ambulatory Visit | Attending: Internal Medicine | Admitting: Internal Medicine

## 2018-10-03 DIAGNOSIS — N83201 Unspecified ovarian cyst, right side: Secondary | ICD-10-CM

## 2019-01-14 ENCOUNTER — Ambulatory Visit (INDEPENDENT_AMBULATORY_CARE_PROVIDER_SITE_OTHER): Payer: Medicare Other

## 2019-01-14 ENCOUNTER — Other Ambulatory Visit: Payer: Self-pay

## 2019-01-14 DIAGNOSIS — Z23 Encounter for immunization: Secondary | ICD-10-CM

## 2019-01-19 ENCOUNTER — Ambulatory Visit (INDEPENDENT_AMBULATORY_CARE_PROVIDER_SITE_OTHER)
Admission: RE | Admit: 2019-01-19 | Discharge: 2019-01-19 | Disposition: A | Discharge status: Home / Self Care | Payer: Medicare Other | Source: Ambulatory Visit | Attending: Internal Medicine | Admitting: Internal Medicine

## 2019-01-19 ENCOUNTER — Other Ambulatory Visit: Payer: Self-pay

## 2019-01-19 DIAGNOSIS — M810 Age-related osteoporosis without current pathological fracture: Secondary | ICD-10-CM | POA: Diagnosis not present

## 2019-02-24 NOTE — Patient Instructions (Addendum)
  Tests ordered today. Your results will be released to MyChart (or called to you) after review.  If any changes need to be made, you will be notified at that same time.    Medications reviewed and updated.  Changes include :   none     Please followup in 6 months   

## 2019-02-24 NOTE — Assessment & Plan Note (Signed)
Diet controlled a1c

## 2019-02-24 NOTE — Progress Notes (Signed)
Subjective:    Patient ID: Rhonda Reynolds, female    DOB: 07/05/1934, 84 y.o.   MRN: [id number]  HPI The patient is here for follow up.  She is not exercising regularly.  She is active with housework.    Osteoporosis:  She is taking calcium and vitamin d daily.  She is getting prolia and is due for an injection today.  She is not exercising regularly.     Diabetes: She is controlling her sugars with diet. She is compliant with a diabetic diet.  She denies numbness/tingling in her feet and foot lesions. She is up-to-date with an ophthalmology examination.   She sleeps with her arms under her on her stomach.  One morning she woke up with numbness in her lateral left three fingers. It lasted for while and has not done it since then.  Her ribs were also painful one night after sleeping on her stomach, but it was only that one time.   She is still smoking.  She would like to quit.  She can go a couple of days but then has to have one.  The nicotine patches are too big for her arm.  She has not tried medication and would prefer not to take any medication.  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  She is still smoking-stressed smoking cessation. She uses the Spiriva some days, but not all days.  She states when she does use it helps.  She tends to use this when she is more active.  Medications and allergies reviewed with patient and updated if appropriate.  Patient Active Problem List   Diagnosis Date Noted  . Chronic pancreatitis ([place]) 01/09/2017  . Abnormal CT of the chest 10/04/2016  . Ovarian cyst 10/04/2016  . Tortuous aorta ([place]) 08/30/2016  . Abdominal pain 08/09/2016  . Diabetes mellitus without complication ([place]) 123XX123  . Protein-calorie malnutrition ([place]) 12/19/2015  . Sensorineural hearing loss of both ears 12/19/2015  . Weight loss 12/19/2015  . Tobacco use disorder 12/19/2015  . History of hyperthyroidism 12/19/2015  . Essential  hypertension 12/18/2015  . PAD (peripheral artery disease) ([place]) 12/18/2015  . Osteoporosis 12/18/2015  . History of left breast cancer 12/18/2015  . Neoplasm of left breast, primary tumor staging category Tis: ductal carcinoma in situ (DCIS) 03/25/2013    Current Outpatient Medications on File Prior to Visit  Medication Sig Dispense Refill  . aspirin EC 81 MG tablet Take 81 mg by mouth daily.    . carboxymethylcellulose (REFRESH PLUS) 0.5 % SOLN 1 drop 3 (three) times daily as needed (DRY EYE).     . cholecalciferol (VITAMIN D) 1000 units tablet Take 3 tablets (3,000 Units total) by mouth daily. 90 tablet 1  . cycloSPORINE (RESTASIS) 0.05 % ophthalmic emulsion Place 1 drop into both eyes 2 (two) times daily. 0.4 mL 5  . guaiFENesin (MUCINEX) 600 MG 12 hr tablet Take by mouth.    . tiotropium (SPIRIVA HANDIHALER) 18 MCG inhalation capsule INHALE THE CONTENTS OF 1 CAPSULE VIA INHALATION DEVICE EVERY DAY 30 capsule 1   No current facility-administered medications on file prior to visit.     Past Medical History:  Diagnosis Date  . Arthritis   . Breast cancer ([place])    left  . Diabetes mellitus without complication ([place])   . GERD (gastroesophageal reflux disease)    occ  . History of radiation therapy 04/15/13-05/12/13   42.5 gray to left breast. lumpectomy cavity to  50 gray  . Hyperlipidemia   . Hypertension   . Hyperthyroidism   . Left fibular fracture 10/29/2016  . Osteoporosis   . Personal history of radiation therapy 2015    Past Surgical History:  Procedure Laterality Date  . ABDOMINAL HYSTERECTOMY    . BREAST LUMPECTOMY Left 03/04/2013  . BREAST LUMPECTOMY WITH NEEDLE LOCALIZATION Left 03/04/2013   Procedure: LEFT BREAST LUMPECTOMY WITH NEEDLE LOCALIZATION;  Surgeon: Imogene Burn. Georgette Dover, MD;  Location: [facility];  Service: General;  Laterality: Left;  . BREAST SURGERY    . EYE SURGERY Bilateral 14  . SPINE SURGERY  15    Social History   Socioeconomic History  . Marital  status: Widowed    Spouse name: Not on file  . Number of children: 4  . Years of education: Not on file  . Highest education level: Not on file  Occupational History  . Not on file  Social Needs  . Financial resource strain: Not very hard  . Food insecurity    Worry: Sometimes true    Inability: Sometimes true  . Transportation needs    Medical: No    Non-medical: No  Tobacco Use  . Smoking status: Current Every Day Smoker    Packs/day: 0.50    Years: 40.00    Pack years: 20.00    Types: Cigarettes  . Smokeless tobacco: Never Used  Substance and Sexual Activity  . Alcohol use: No  . Drug use: No  . Sexual activity: Never  Lifestyle  . Physical activity    Days per week: 0 days    Minutes per session: 0 min  . Stress: Only a little  Relationships  . Social connections    Talks on phone: More than three times a week    Gets together: More than three times a week    Attends religious service: More than 4 times per year    Active member of club or organization: Not on file    Attends meetings of clubs or organizations: More than 4 times per year    Relationship status: Widowed  Other Topics Concern  . Not on file  Social History Narrative   Widowed   Retired Control and instrumentation engineer    Family History  Problem Relation Age of Onset  . Cancer Mother        pt canot remember  . Diabetes Mother   . Stroke Mother   . Heart failure Father   . Lung cancer Brother     Review of Systems  Constitutional: Negative for chills and fever.  Respiratory: Positive for cough (from smoking) and wheezing (occ with smoking). Negative for shortness of breath.   Cardiovascular: Negative for chest pain, palpitations and leg swelling.  Neurological: Negative for dizziness, light-headedness and headaches.       Objective:   Vitals:   02/25/19 0813  BP: (!) 188/100  Resp: 16  Temp: 98 F (36.7 C)   BP Readings from Last 3 Encounters:  02/25/19 (!) 188/100  09/17/18 (!) 174/102   09/13/17 134/82   Wt Readings from Last 3 Encounters:  02/25/19 89 lb (40.4 kg)  09/17/18 87 lb (39.5 kg)  09/13/17 84 lb 6.4 oz (38.3 kg)   Body mass index is 14.36 kg/m.   Physical Exam    Constitutional: Appears thin. No distress.  HENT:  Head: Normocephalic and atraumatic.  Neck: Neck supple. No tracheal deviation present. No thyromegaly present.  No cervical lymphadenopathy Cardiovascular: Normal rate, regular rhythm and  normal heart sounds.   No murmur heard. No carotid bruit .  No edema Pulmonary/Chest: Effort normal and breath sounds normal. No respiratory distress. No has no wheezes. No rales.  Skin: Skin is warm and dry. Not diaphoretic.  Psychiatric: Normal mood and affect. Behavior is normal.      Assessment & Plan:    See Problem List for Assessment and Plan of chronic medical problems.   This visit occurred during the SARS-CoV-2 public health emergency.  Safety protocols were in place, including screening questions prior to the visit, additional usage of staff PPE, and extensive cleaning of exam room while observing appropriate contact time as indicated for disinfecting solutions.

## 2019-02-24 NOTE — Assessment & Plan Note (Signed)
On prolia - injection today dexa up to date Taking vitamin d daily - check level

## 2019-02-25 ENCOUNTER — Other Ambulatory Visit: Payer: Self-pay | Admitting: Internal Medicine

## 2019-02-25 ENCOUNTER — Other Ambulatory Visit (INDEPENDENT_AMBULATORY_CARE_PROVIDER_SITE_OTHER): Payer: Medicare Other

## 2019-02-25 ENCOUNTER — Encounter: Payer: Self-pay | Admitting: Internal Medicine

## 2019-02-25 ENCOUNTER — Ambulatory Visit (INDEPENDENT_AMBULATORY_CARE_PROVIDER_SITE_OTHER): Payer: Medicare Other | Admitting: *Deleted

## 2019-02-25 ENCOUNTER — Ambulatory Visit (INDEPENDENT_AMBULATORY_CARE_PROVIDER_SITE_OTHER): Payer: Medicare Other | Admitting: Internal Medicine

## 2019-02-25 ENCOUNTER — Other Ambulatory Visit: Payer: Self-pay

## 2019-02-25 VITALS — BP 164/86 | HR 85 | Resp 16 | Ht 66.0 in | Wt 89.0 lb

## 2019-02-25 VITALS — BP 188/100 | Temp 98.0°F | Resp 16 | Ht 66.0 in | Wt 89.0 lb

## 2019-02-25 DIAGNOSIS — F17200 Nicotine dependence, unspecified, uncomplicated: Secondary | ICD-10-CM

## 2019-02-25 DIAGNOSIS — Z789 Other specified health status: Secondary | ICD-10-CM

## 2019-02-25 DIAGNOSIS — J432 Centrilobular emphysema: Secondary | ICD-10-CM

## 2019-02-25 DIAGNOSIS — I10 Essential (primary) hypertension: Secondary | ICD-10-CM

## 2019-02-25 DIAGNOSIS — E119 Type 2 diabetes mellitus without complications: Secondary | ICD-10-CM | POA: Diagnosis not present

## 2019-02-25 DIAGNOSIS — F17210 Nicotine dependence, cigarettes, uncomplicated: Secondary | ICD-10-CM

## 2019-02-25 DIAGNOSIS — J439 Emphysema, unspecified: Secondary | ICD-10-CM | POA: Insufficient documentation

## 2019-02-25 DIAGNOSIS — Z0000 Encounter for general adult medical examination without abnormal findings: Secondary | ICD-10-CM | POA: Diagnosis not present

## 2019-02-25 DIAGNOSIS — M810 Age-related osteoporosis without current pathological fracture: Secondary | ICD-10-CM

## 2019-02-25 LAB — COMPREHENSIVE METABOLIC PANEL
ALT: 7 U/L (ref 0–35)
AST: 15 U/L (ref 0–37)
Albumin: 4.5 g/dL (ref 3.5–5.2)
Alkaline Phosphatase: 78 U/L (ref 39–117)
BUN: 11 mg/dL (ref 6–23)
CO2: 34 mEq/L — ABNORMAL HIGH (ref 19–32)
Calcium: 10 mg/dL (ref 8.4–10.5)
Chloride: 101 mEq/L (ref 96–112)
Creatinine, Ser: 0.64 mg/dL (ref 0.40–1.20)
GFR: 107.44 mL/min (ref >60.00)
Glucose, Bld: 108 mg/dL — ABNORMAL HIGH (ref 70–99)
Potassium: 4.7 mEq/L (ref 3.5–5.1)
Sodium: 141 mEq/L (ref 135–145)
Total Bilirubin: 0.6 mg/dL (ref 0.2–1.2)
Total Protein: 8 g/dL (ref 6.0–8.3)

## 2019-02-25 LAB — LIPID PANEL
Cholesterol: 168 mg/dL (ref 0–200)
HDL: 61.5 mg/dL (ref >39.00)
LDL Cholesterol: 93 mg/dL (ref 0–99)
NonHDL: 106.01
Total CHOL/HDL Ratio: 3
Triglycerides: 67 mg/dL (ref 0.0–149.0)
VLDL: 13.4 mg/dL (ref 0.0–40.0)

## 2019-02-25 LAB — CBC WITH DIFFERENTIAL/PLATELET
Basophils Absolute: 0 10*3/uL (ref 0.0–0.1)
Basophils Relative: 0.7 % (ref 0.0–3.0)
Eosinophils Absolute: 0.1 10*3/uL (ref 0.0–0.7)
Eosinophils Relative: 1.1 % (ref 0.0–5.0)
HCT: 43.9 % (ref 36.0–46.0)
Hemoglobin: 14.4 g/dL (ref 12.0–15.0)
Lymphocytes Relative: 18.5 % (ref 12.0–46.0)
Lymphs Abs: 1.3 10*3/uL (ref 0.7–4.0)
MCHC: 32.9 g/dL (ref 30.0–36.0)
MCV: 92.5 fl (ref 78.0–100.0)
Monocytes Absolute: 0.6 10*3/uL (ref 0.1–1.0)
Monocytes Relative: 8.9 % (ref 3.0–12.0)
Neutro Abs: 5 10*3/uL (ref 1.4–7.7)
Neutrophils Relative %: 70.8 % (ref 43.0–77.0)
Platelets: 204 10*3/uL (ref 150.0–400.0)
RBC: 4.74 Mil/uL (ref 3.87–5.11)
RDW: 13.4 % (ref 11.5–15.5)
WBC: 7 10*3/uL (ref 4.0–10.5)

## 2019-02-25 LAB — HEMOGLOBIN A1C: Hgb A1c MFr Bld: 6.5 % (ref 4.6–6.5)

## 2019-02-25 LAB — MICROALBUMIN / CREATININE URINE RATIO
Creatinine,U: 144.3 mg/dL
Microalb Creat Ratio: 5.3 mg/g (ref 0.0–30.0)
Microalb, Ur: 7.6 mg/dL — ABNORMAL HIGH (ref 0.0–1.9)

## 2019-02-25 LAB — TSH: TSH: 0.71 u[IU]/mL (ref 0.35–4.50)

## 2019-02-25 LAB — VITAMIN D 25 HYDROXY (VIT D DEFICIENCY, FRACTURES): VITD: 35.58 ng/mL (ref 30.00–100.00)

## 2019-02-25 MED ORDER — AMLODIPINE BESYLATE 5 MG PO TABS: 5.0000 mg | ORAL_TABLET | Freq: Every day | ORAL | 1 refills | Status: DC

## 2019-02-25 MED ORDER — DENOSUMAB 60 MG/ML ~~LOC~~ SOSY
60.0000 mg | PREFILLED_SYRINGE | Freq: Once | SUBCUTANEOUS | Status: AC
  Administered 2019-02-25: 10:00:00 60 mg via SUBCUTANEOUS

## 2019-02-25 MED ORDER — SPIRIVA HANDIHALER 18 MCG IN CAPS: ORAL_CAPSULE | RESPIRATORY_TRACT | 11 refills | Status: DC

## 2019-02-25 NOTE — Telephone Encounter (Signed)
Pt aware of response below and expressed understanding.  

## 2019-02-25 NOTE — Assessment & Plan Note (Signed)
Blood pressure very elevated here today.  We will recheck this with a pediatric cuff because her arms are so thin Recheck shows still elevated Start amlodipine 5 mg daily Advised her to monitor at home Follow-up in about 1 month CMP

## 2019-02-25 NOTE — Progress Notes (Addendum)
Subjective:   Rhonda Reynolds is a 84 y.o. female who presents for Medicare Annual (Subsequent) preventive examination.  This visit occurred during the SARS-CoV-2 public health emergency.  Safety protocols were in place, including screening questions prior to the visit, additional usage of staff PPE, and extensive cleaning of exam room while observing appropriate contact time as indicated for disinfecting solutions.   Review of Systems:   Cardiac Risk Factors include: advanced age (>70men, >28 women);diabetes mellitus;dyslipidemia;hypertension Sleep patterns: feels rested on waking, gets up 0-1 times nightly to void and sleeps 7-8 hours nightly.    Home Safety/Smoke Alarms: Feels safe in home. Smoke alarms in place.  Living environment; residence and Firearm Safety: apartment. Lives alone no needs for DME, limited support system locally. Seat Belt Safety/Bike Helmet: Wears seat belt.     Objective:     Vitals: BP (!) 164/86 (BP Location: Left Arm, Cuff Size: Small)   Pulse 85   Resp 16   Ht 5\' 6"  (1.676 m)   Wt 89 lb (40.4 kg)   SpO2 95%   BMI 14.36 kg/m   Body mass index is 14.36 kg/m.  Advanced Directives 02/25/2019 05/29/2017 05/13/2017 03/02/2013  Does Patient Have a Medical Advance Directive? Yes Yes Yes Patient has advance directive, copy not in chart  Type of Advance Directive [place];Living will [place];Living will [place];Living will Living will;Healthcare Power of [place] in Chart? No - copy requested No - copy requested No - copy requested -    Tobacco Social History   Tobacco Use  Smoking Status Current Every Day Smoker  . Packs/day: 0.50  . Years: 40.00  . Pack years: 20.00  . Types: Cigarettes  Smokeless Tobacco Never Used     Ready to quit: Not Answered Counseling given: Not Answered   Past Medical History:  Diagnosis Date  . Arthritis   . Breast  cancer ([facility])    left  . Diabetes mellitus without complication ([place])   . GERD (gastroesophageal reflux disease)    occ  . History of radiation therapy 04/15/13-05/12/13   42.5 gray to left breast. lumpectomy cavity to 50 gray  . Hyperlipidemia   . Hypertension   . Hyperthyroidism   . Left fibular fracture 10/29/2016  . Osteoporosis   . Personal history of radiation therapy 2015   Past Surgical History:  Procedure Laterality Date  . ABDOMINAL HYSTERECTOMY    . BREAST LUMPECTOMY Left 03/04/2013  . BREAST LUMPECTOMY WITH NEEDLE LOCALIZATION Left 03/04/2013   Procedure: LEFT BREAST LUMPECTOMY WITH NEEDLE LOCALIZATION;  Surgeon: Imogene Burn. Georgette Dover, MD;  Location: [facility];  Service: General;  Laterality: Left;  . BREAST SURGERY    . EYE SURGERY Bilateral 14  . SPINE SURGERY  14   Family History  Problem Relation Age of Onset  . Cancer Mother        pt canot remember  . Diabetes Mother   . Stroke Mother   . Heart failure Father   . Lung cancer Brother    Social History   Socioeconomic History  . Marital status: Widowed    Spouse name: Not on file  . Number of children: 4  . Years of education: Not on file  . Highest education level: Not on file  Occupational History  . Occupation: retired  Scientific laboratory technician  . Financial resource strain: Not very hard  . Food insecurity    Worry: Sometimes  true    Inability: Sometimes true  . Transportation needs    Medical: No    Non-medical: No  Tobacco Use  . Smoking status: Current Every Day Smoker    Packs/day: 0.50    Years: 40.00    Pack years: 20.00    Types: Cigarettes  . Smokeless tobacco: Never Used  Substance and Sexual Activity  . Alcohol use: No  . Drug use: No  . Sexual activity: Never  Lifestyle  . Physical activity    Days per week: 0 days    Minutes per session: 0 min  . Stress: Only a little  Relationships  . Social connections    Talks on phone: More than three times a week    Gets together: More than three times a  week    Attends religious service: More than 4 times per year    Active member of club or organization: Not on file    Attends meetings of clubs or organizations: More than 4 times per year    Relationship status: Widowed  Other Topics Concern  . Not on file  Social History Narrative   Widowed   Retired Control and instrumentation engineer    Outpatient Encounter Medications as of 02/25/2019  Medication Sig  . aspirin EC 81 MG tablet Take 81 mg by mouth daily.  . carboxymethylcellulose (REFRESH PLUS) 0.5 % SOLN 1 drop 3 (three) times daily as needed (DRY EYE).   . cholecalciferol (VITAMIN D) 1000 units tablet Take 3 tablets (3,000 Units total) by mouth daily.  . cycloSPORINE (RESTASIS) 0.05 % ophthalmic emulsion Place 1 drop into both eyes 2 (two) times daily.  Marland Kitchen tiotropium (SPIRIVA HANDIHALER) 18 MCG inhalation capsule INHALE THE CONTENTS OF 1 CAPSULE VIA INHALATION DEVICE EVERY DAY  . guaiFENesin (MUCINEX) 600 MG 12 hr tablet Take by mouth.  . [DISCONTINUED] tiotropium (SPIRIVA HANDIHALER) 18 MCG inhalation capsule INHALE THE CONTENTS OF 1 CAPSULE VIA INHALATION DEVICE EVERY DAY   No facility-administered encounter medications on file as of 02/25/2019.     Activities of Daily Living In your present state of health, do you have any difficulty performing the following activities: 02/25/2019  Hearing? N  Vision? N  Difficulty concentrating or making decisions? N  Walking or climbing stairs? N  Dressing or bathing? N  Doing errands, shopping? N  Preparing Food and eating ? N  Using the Toilet? N  In the past six months, have you accidently leaked urine? N  Do you have problems with loss of bowel control? N  Managing your Medications? N  Managing your Finances? N  Housekeeping or managing your Housekeeping? N  Some recent data might be hidden    Patient Care Team: Binnie Rail, MD as PCP - General (Internal Medicine) Hortencia Pilar, MD as Consulting Physician (Surgery) Nahser, Wonda Cheng, MD as Consulting Physician (Cardiology) Jerrell Belfast, MD as Consulting Physician (Otolaryngology)    Assessment:   This is a routine wellness examination for Rhonda Reynolds. Physical assessment deferred to PCP.  Exercise Activities and Dietary recommendations Current Exercise Habits: The patient does not participate in regular exercise at present(education provided regarding benefits of exercise and osteoporosis, diabetes, and hypertension), Exercise limited by: None identified  Diet (meal preparation, eat out, water intake, caffeinated beverages, dairy products, fruits and vegetables): in general, an "unhealthy" diet. Reports poor appetite and often eats prepared foods.   Reviewed heart healthy and diabetic diet. Encouraged patient to increase daily water and healthy fluid intake. Discussed supplementing with  Ensure, samples and coupons provided. Encouraged patient to cook in bulk using crock pot and/or casserole dishes of which she can control the ingredients.   Goals    . Patient Stated     Stay as healthy and as independent as possible by monitoring my diet for sugar and carbohydrates, increasing my physical activity by looking into the [facility], and drink more water.     . Patient Stated     I want to start to sew again, I will try store reading glasses to help me see better.        Fall Risk Fall Risk  02/25/2019 02/25/2019 09/17/2018 05/29/2017 05/13/2017  Falls in the past year? 1 0 1 Yes Yes  Number falls in past yr: 0 0 1 2 or more 1  Injury with Fall? 0 - 0 Yes Yes  Risk for fall due to : Impaired balance/gait - - Impaired balance/gait -  Follow up - - - Falls prevention discussed Falls prevention discussed   Is the patient's home free of loose throw rugs in walkways, pet beds, electrical cords, etc?   yes      Grab bars in the bathroom? no      Handrails on the stairs?   yes      Adequate lighting?   yes  Depression Screen PHQ 2/9 Scores 02/25/2019 09/17/2018 05/29/2017  05/13/2017  PHQ - 2 Score 0 0 0 0  PHQ- 9 Score - - - 3     Cognitive Function MMSE - Mini Mental State Exam 05/13/2017  Orientation to time 5  Orientation to Place 5  Registration 3  Attention/ Calculation 5  Recall 1  Language- name 2 objects 2  Language- repeat 1  Language- follow 3 step command 3  Language- read & follow direction 1  Write a sentence 1  Copy design 1  Total score 28     6CIT Screen 02/25/2019  What Year? 0 points  What month? 0 points  What time? 0 points  Count back from 20 0 points  Months in reverse 0 points  Repeat phrase 0 points  Total Score 0    Immunization History  Administered Date(s) Administered  . Fluad Quad(high Dose 65+) 01/14/2019  . Influenza, High Dose Seasonal PF 12/19/2015, 01/09/2017, 12/30/2017  . Pneumococcal Conjugate-13 02/02/2014  . Pneumococcal Polysaccharide-23 12/10/2001  . Tdap 04/20/2013   Screening Tests Health Maintenance  Topic Date Due  . URINE MICROALBUMIN  05/31/2018  . OPHTHALMOLOGY EXAM  04/30/2019  . FOOT EXAM  09/17/2019  . DEXA SCAN  01/18/2021  . TETANUS/TDAP  04/21/2023  . INFLUENZA VACCINE  Completed  . PNA vac Low Risk Adult  Completed      Plan:    Reviewed health maintenance screenings with patient today and relevant education, vaccines, and/or referrals were provided.   I have personally reviewed and noted the following in the patient's chart:   . Medical and social history . Use of alcohol, tobacco or illicit drugs  . Current medications and supplements . Functional ability and status . Nutritional status . Physical activity . Advanced directives . List of other physicians . Screenings to include cognitive, depression, and falls . Referrals and appointments  In addition, I have reviewed and discussed with patient certain preventive protocols, quality metrics, and best practice recommendations. A written personalized care plan for preventive services as well as general preventive  health recommendations were provided to patient.     Michiel Cowboy, RN  02/25/2019    Medical screening examination/treatment/procedure(s) were performed by non-physician practitioner and as supervising physician I was immediately available for consultation/collaboration. I agree with above. Binnie Rail, MD

## 2019-02-25 NOTE — Assessment & Plan Note (Addendum)
Still smoking - stressed smoking cessation - she will try the patches again Occasional cough and wheeze from smoking, but denies shortness of breath Uses spiriva some days-typically the day she is more active.  It does help, but she does not feel that she needs it on a daily basis

## 2019-02-25 NOTE — Telephone Encounter (Signed)
Since her blood pressure was elevated when rechecked here we need to start a medication.  Amlodipine 5 mg daily pending.  I like her to take this once a day.  If she has any side effects she should let us know.  If she can monitor her blood pressure at home that would be ideal.  Lets have her follow-up with [place] in about 1 month so we can recheck this.  Overall blood work looks good.  Cholesterol is good.  Vitamin D level is normal.  Kidney function, liver test, blood counts, thyroid function and urine are all normal.  A1c is 6.5%-sugars are well controlled.

## 2019-02-25 NOTE — Patient Instructions (Addendum)
[employer] LiveWell Line at ([phone])[phone]  1-800-QUIT-NOW 787-133-9805). http://bell-fernandez.com/  Continue doing brain stimulating activities (puzzles, reading, adult coloring books, staying active) to keep memory sharp.   Continue to eat heart healthy diet (full of fruits, vegetables, whole grains, lean protein, water--limit salt, fat, and sugar intake) and increase physical activity as tolerated.   Ms. Rhonda Reynolds , Thank you for taking time to come for your Medicare Wellness Visit. I appreciate your ongoing commitment to your health goals. Please review the following plan we discussed and let me know if I can assist you in the future.   These are the goals we discussed: Goals    . Patient Stated     Stay as healthy and as independent as possible by monitoring my diet for sugar and carbohydrates, increasing my physical activity by looking into the [facility], and drink more water.     . Patient Stated     I want to start to sew again, I will try store reading glasses to help me see better.        This is a list of the screening recommended for you and due dates:  Health Maintenance  Topic Date Due  . Urine Protein Check  05/31/2018  . Eye exam for diabetics  04/30/2019  . Complete foot exam   09/17/2019  . DEXA scan (bone density measurement)  01/18/2021  . Tetanus Vaccine  04/21/2023  . Flu Shot  Completed  . Pneumonia vaccines  Completed    Preventive Care 67 Years and Older, Female Preventive care refers to lifestyle choices and visits with your health care provider that can promote health and wellness. This includes:  A yearly physical exam. This is also called an annual well check.  Regular dental and eye exams.  Immunizations.  Screening for certain conditions.  Healthy lifestyle choices, such as diet and exercise. What can I expect for my preventive care visit? Physical  exam Your health care provider will check:  Height and weight. These may be used to calculate body mass index (BMI), which is a measurement that tells if you are at a healthy weight.  Heart rate and blood pressure.  Your skin for abnormal spots. Counseling Your health care provider may ask you questions about:  Alcohol, tobacco, and drug use.  Emotional well-being.  Home and relationship well-being.  Sexual activity.  Eating habits.  History of falls.  Memory and ability to understand (cognition).  Work and work Statistician.  Pregnancy and menstrual history. What immunizations do I need?  Influenza (flu) vaccine  This is recommended every year. Tetanus, diphtheria, and pertussis (Tdap) vaccine  You may need a Td booster every 10 years. Varicella (chickenpox) vaccine  You may need this vaccine if you have not already been vaccinated. Zoster (shingles) vaccine  You may need this after age 79. Pneumococcal conjugate (PCV13) vaccine  One dose is recommended after age 53. Pneumococcal polysaccharide (PPSV23) vaccine  One dose is recommended after age 27. Measles, mumps, and rubella (MMR) vaccine  You may need at least one dose of MMR if you were born in 1957 or later. You may also need a second dose. Meningococcal conjugate (MenACWY) vaccine  You may need this if you have certain conditions. Hepatitis A vaccine  You may need this if you have certain conditions or if you travel or work in places where you may be exposed to hepatitis A. Hepatitis B vaccine  You may need this if you have certain conditions or if you  travel or work in places where you may be exposed to hepatitis B. Haemophilus influenzae type b (Hib) vaccine  You may need this if you have certain conditions. You may receive vaccines as individual doses or as more than one vaccine together in one shot (combination vaccines). Talk with your health care provider about the risks and benefits of  combination vaccines. What tests do I need? Blood tests  Lipid and cholesterol levels. These may be checked every 5 years, or more frequently depending on your overall health.  Hepatitis C test.  Hepatitis B test. Screening  Lung cancer screening. You may have this screening every year starting at age 69 if you have a 30-pack-year history of smoking and currently smoke or have quit within the past 15 years.  Colorectal cancer screening. All adults should have this screening starting at age 54 and continuing until age 35. Your health care provider may recommend screening at age 94 if you are at increased risk. You will have tests every 1-10 years, depending on your results and the type of screening test.  Diabetes screening. This is done by checking your blood sugar (glucose) after you have not eaten for a while (fasting). You may have this done every 1-3 years.  Mammogram. This may be done every 1-2 years. Talk with your health care provider about how often you should have regular mammograms.  BRCA-related cancer screening. This may be done if you have a family history of breast, ovarian, tubal, or peritoneal cancers. Other tests  Sexually transmitted disease (STD) testing.  Bone density scan. This is done to screen for osteoporosis. You may have this done starting at age 59. Follow these instructions at home: Eating and drinking  Eat a diet that includes fresh fruits and vegetables, whole grains, lean protein, and low-fat dairy products. Limit your intake of foods with high amounts of sugar, saturated fats, and salt.  Take vitamin and mineral supplements as recommended by your health care provider.  Do not drink alcohol if your health care provider tells you not to drink.  If you drink alcohol: ? Limit how much you have to 0-1 drink a day. ? Be aware of how much alcohol is in your drink. In the [place] one drink equals one 12 oz bottle of beer (355 mL), one 5 oz glass of Teresa Nicodemus (148  mL), or one 1 oz glass of hard liquor (44 mL). Lifestyle  Take daily care of your teeth and gums.  Stay active. Exercise for at least 30 minutes on 5 or more days each week.  Do not use any products that contain nicotine or tobacco, such as cigarettes, e-cigarettes, and chewing tobacco. If you need help quitting, ask your health care provider.  If you are sexually active, practice safe sex. Use a condom or other form of protection in order to prevent STIs (sexually transmitted infections).  Talk with your health care provider about taking a low-dose aspirin or statin. What's next?  Go to your health care provider once a year for a well check visit.  Ask your health care provider how often you should have your eyes and teeth checked.  Stay up to date on all vaccines. This information is not intended to replace advice given to you by your health care provider. Make sure you discuss any questions you have with your health care provider. Document Released: 04/08/2015 Document Revised: 03/06/2018 Document Reviewed: 03/06/2018 Elsevier Patient Education  2020 [name].

## 2019-02-25 NOTE — Assessment & Plan Note (Signed)
Smoking cessation was discussed for more than 3 minutes.  The patient was counseled on the dangers of tobacco use, and was advised to quit and willing to try.  Reviewed ways of quitting smoking including nicotine replacement, vapping/e-cigarettes, cold [place], weaning off cigarettes, and pharmacotherapy (wellbutrin and chantix).  She has used nicotine patches in the past and they did work.  They are too big for her arms.    She does want to quit, and is willing to try again. Marland Kitchen

## 2019-03-04 ENCOUNTER — Telehealth: Payer: Self-pay

## 2019-03-04 NOTE — Telephone Encounter (Signed)
Copied from [place] [id number]. Topic: General - Other >> Mar 04, 2019 12:58 PM Leward Quan A wrote: Reason for CRM: Patient would like a call back from Dr Quay Burow nurse in reference to her coming in contact with someone that was in contact with someone that tested positive for covid. Ph# [phone]

## 2019-03-04 NOTE — Telephone Encounter (Signed)
Spoke with pt about concerns. The person that was exposed to [place] went to get tested. Will await those results before any further action. Told her to monitor her symptoms as well.

## 2019-03-04 NOTE — Telephone Encounter (Signed)
Copied from [place] [id number]. Topic: Referral - Status >> Mar 04, 2019  123456 PM Simone Curia D wrote: [phone] Spoke with patient about dental resources, food pantries and meal on wheels. Ambrose Mantle [phone]

## 2019-03-11 ENCOUNTER — Telehealth: Payer: Self-pay

## 2019-03-11 NOTE — Telephone Encounter (Signed)
Copied from [place] [phone]. Topic: Referral - Status >> Mar 11, 2019  [id number] PM Simone Curia D wrote: [phone] Spoke with patient she has plenty of food and will call [place] benefits in a few days she also plans on calling [facility]. Patient stated she will contact the office if she needs anything else.  Ambrose Mantle [phone]

## 2019-03-18 ENCOUNTER — Telehealth: Payer: Self-pay

## 2019-03-18 NOTE — Telephone Encounter (Signed)
Pt wanted to know if we just so happened to receive her recent COVID results. She has not gotten results back yet and it has been a week. I let her know we have not seen results.

## 2019-03-18 NOTE — Telephone Encounter (Signed)
Copied from [place] [id number]. Topic: General - Other >> Mar 18, 2019 12:53 PM Yvette Rack wrote: Reason for CRM: Pt stated she was tested for Covid-19 and she needs Dr. Quay Burow' nurse to return her call. Pt stated she does not want to talk with anyone other than Dr. Quay Burow' nurse

## 2019-04-01 NOTE — Progress Notes (Signed)
Subjective:    Patient ID: Rhonda Reynolds, female    DOB: 03-31-1934, 85 y.o.   MRN: [id number]  HPI The patient is here for follow up of her hypertension.   Last month we started amlodipine 5 mg daily for uncontrolled htn.  She is taking all of her medications as prescribed.  She denies side effects from the amlodipine.  She has not checked her BP at home.    She has no concerns.  She is still smoking.    Medications and allergies reviewed with patient and updated if appropriate.  Patient Active Problem List   Diagnosis Date Noted  . COPD with emphysema ([place]) 02/25/2019  . Chronic pancreatitis ([place]) 01/09/2017  . Abnormal CT of the chest 10/04/2016  . Ovarian cyst 10/04/2016  . Tortuous aorta ([place]) 08/30/2016  . Abdominal pain 08/09/2016  . Diabetes mellitus without complication ([place]) [id number]  . Protein-calorie malnutrition ([place]) 12/19/2015  . Sensorineural hearing loss of both ears 12/19/2015  . Weight loss 12/19/2015  . Tobacco use disorder 12/19/2015  . History of hyperthyroidism 12/19/2015  . Essential hypertension 12/18/2015  . PAD (peripheral artery disease) ([place]) 12/18/2015  . Osteoporosis 12/18/2015  . History of left breast cancer 12/18/2015  . Neoplasm of left breast, primary tumor staging category Tis: ductal carcinoma in situ (DCIS) 03/25/2013    Current Outpatient Medications on File Prior to Visit  Medication Sig Dispense Refill  . amLODipine (NORVASC) 5 MG tablet TAKE 1 TABLET(5 MG) BY MOUTH DAILY 90 tablet 1  . aspirin EC 81 MG tablet Take 81 mg by mouth daily.    . carboxymethylcellulose (REFRESH PLUS) 0.5 % SOLN 1 drop 3 (three) times daily as needed (DRY EYE).     . cholecalciferol (VITAMIN D) 1000 units tablet Take 3 tablets (3,000 Units total) by mouth daily. 90 tablet 1  . cycloSPORINE (RESTASIS) 0.05 % ophthalmic emulsion Place 1 drop into both eyes 2 (two) times daily. 0.4 mL 5  . guaiFENesin (MUCINEX) 600 MG 12 hr tablet Take by mouth.      . tiotropium (SPIRIVA HANDIHALER) 18 MCG inhalation capsule INHALE THE CONTENTS OF 1 CAPSULE VIA INHALATION DEVICE EVERY DAY 30 capsule 11   No current facility-administered medications on file prior to visit.    Past Medical History:  Diagnosis Date  . Arthritis   . Breast cancer ([place])    left  . Diabetes mellitus without complication ([place])   . GERD (gastroesophageal reflux disease)    occ  . History of radiation therapy 04/15/13-05/12/13   42.5 gray to left breast. lumpectomy cavity to 50 gray  . Hyperlipidemia   . Hypertension   . Hyperthyroidism   . Left fibular fracture 10/29/2016  . Osteoporosis   . Personal history of radiation therapy 2015    Past Surgical History:  Procedure Laterality Date  . ABDOMINAL HYSTERECTOMY    . BREAST LUMPECTOMY Left 03/04/2013  . BREAST LUMPECTOMY WITH NEEDLE LOCALIZATION Left 03/04/2013   Procedure: LEFT BREAST LUMPECTOMY WITH NEEDLE LOCALIZATION;  Surgeon: Imogene Burn. Georgette Dover, MD;  Location: [facility];  Service: General;  Laterality: Left;  . BREAST SURGERY    . EYE SURGERY Bilateral 14  . SPINE SURGERY  2    Social History   Socioeconomic History  . Marital status: Widowed    Spouse name: Not on file  . Number of children: 4  . Years of education: Not on file  . Highest education level: Not on file  Occupational History  .  Occupation: retired  Tobacco Use  . Smoking status: Current Every Day Smoker    Packs/day: 0.50    Years: 40.00    Pack years: 20.00    Types: Cigarettes  . Smokeless tobacco: Never Used  Substance and Sexual Activity  . Alcohol use: No  . Drug use: No  . Sexual activity: Never  Other Topics Concern  . Not on file  Social History Narrative   Widowed   Retired Control and instrumentation engineer   Social Determinants of Health   Financial Resource Strain:   . Difficulty of Paying Living Expenses: Not on file  Food Insecurity:   . Worried About Charity fundraiser in the Last Year: Not on file  . Ran Out of Food in  the Last Year: Not on file  Transportation Needs:   . Lack of Transportation (Medical): Not on file  . Lack of Transportation (Non-Medical): Not on file  Physical Activity:   . Days of Exercise per Week: Not on file  . Minutes of Exercise per Session: Not on file  Stress:   . Feeling of Stress : Not on file  Social Connections:   . Frequency of Communication with Friends and Family: Not on file  . Frequency of Social Gatherings with Friends and Family: Not on file  . Attends Religious Services: Not on file  . Active Member of Clubs or Organizations: Not on file  . Attends Archivist Meetings: Not on file  . Marital Status: Not on file    Family History  Problem Relation Age of Onset  . Cancer Mother        pt canot remember  . Diabetes Mother   . Stroke Mother   . Heart failure Father   . Lung cancer Brother     Review of Systems  Constitutional: Negative for fever.  Respiratory: Positive for shortness of breath (COPD, smoking still).   Cardiovascular: Negative for chest pain, palpitations and leg swelling.  Neurological: Negative for dizziness, light-headedness and headaches.       Objective:   Vitals:   04/02/19 0912  BP: (!) 144/82  Pulse: 83  Temp: 97.9 F (36.6 C)  SpO2: 95%   BP Readings from Last 3 Encounters:  04/02/19 (!) 144/82  02/25/19 (!) 164/86  02/25/19 (!) 188/100   Wt Readings from Last 3 Encounters:  04/02/19 84 lb 12.8 oz (38.5 kg)  02/25/19 89 lb (40.4 kg)  02/25/19 89 lb (40.4 kg)   Body mass index is 13.69 kg/m.   Physical Exam    Constitutional: Appears well-developed and well-nourished. No distress.  HENT:  Head: Normocephalic and atraumatic.  Neck: Neck supple. No tracheal deviation present. No thyromegaly present.  No cervical lymphadenopathy Cardiovascular: Normal rate, regular rhythm and normal heart sounds.  No murmur heard. No carotid bruit .  No edema Pulmonary/Chest: Effort normal and breath sounds normal. No  respiratory distress. No has no wheezes. No rales.  Skin: Skin is warm and dry. Not diaphoretic.  Psychiatric: Normal mood and affect. Behavior is normal.      Assessment & Plan:    See Problem List for Assessment and Plan of chronic medical problems.    This visit occurred during the SARS-CoV-2 public health emergency.  Safety protocols were in place, including screening questions prior to the visit, additional usage of staff PPE, and extensive cleaning of exam room while observing appropriate contact time as indicated for disinfecting solutions.

## 2019-04-02 ENCOUNTER — Ambulatory Visit (INDEPENDENT_AMBULATORY_CARE_PROVIDER_SITE_OTHER): Payer: Medicare Other | Admitting: Internal Medicine

## 2019-04-02 ENCOUNTER — Other Ambulatory Visit: Payer: Self-pay

## 2019-04-02 ENCOUNTER — Encounter: Payer: Self-pay | Admitting: Internal Medicine

## 2019-04-02 VITALS — BP 144/82 | HR 83 | Temp 97.9°F | Ht 66.0 in | Wt 84.8 lb

## 2019-04-02 DIAGNOSIS — I10 Essential (primary) hypertension: Secondary | ICD-10-CM

## 2019-04-02 NOTE — Assessment & Plan Note (Signed)
Chronic Better controlled with addition of amlodipine 5 mg daily and now controlled Continue amlodipine 5 mg daily Encourage smoking cessation, but at this point she has no desire to stop smoking Follow-up in 6 months, sooner if needed

## 2019-04-02 NOTE — Patient Instructions (Addendum)
   Medications reviewed and updated.  Changes include :   none     Please followup in 6 months     ENT - Dr Lucia Gaskins at [facility] ENT - Dr Constance Holster at [facility]

## 2019-06-18 ENCOUNTER — Telehealth: Payer: Self-pay

## 2019-06-18 NOTE — Telephone Encounter (Signed)
I am not sure if I understand completely --  She was dx with breast cancer in 2014 and was treated.  Her last mammo was 08/2018 and it looked good - no evidence of recurrence or concern from the radiologist that the mammogram was not sufficient screening.

## 2019-06-18 NOTE — Telephone Encounter (Signed)
Pt states she is having issues with her breasts. States she is having tenderness and wants to have it checked out. Wants to know what steps to take. I told her she may need an OV but she wants to skip that step if possible bc she knows that she will be referred anyways. She is calling her oncologist to see what they want her to do and will call back.

## 2019-06-18 NOTE — Telephone Encounter (Signed)
New message   Patient voiced the last couple of years dx with breast cancer.   The skin too dense due to the mammogram unable to see the picture.  Suggestion from Dr. Quay Burow would be helpful  Cancer-free.

## 2019-07-09 ENCOUNTER — Other Ambulatory Visit: Payer: Medicare Other

## 2019-08-26 ENCOUNTER — Ambulatory Visit: Payer: Self-pay | Admitting: Internal Medicine

## 2019-09-03 ENCOUNTER — Other Ambulatory Visit: Payer: Medicare Other

## 2019-09-21 ENCOUNTER — Other Ambulatory Visit: Payer: Self-pay | Admitting: Internal Medicine

## 2019-10-22 ENCOUNTER — Other Ambulatory Visit: Payer: Self-pay

## 2019-10-22 ENCOUNTER — Ambulatory Visit
Admission: RE | Admit: 2019-10-22 | Discharge: 2019-10-22 | Disposition: A | Discharge status: Home / Self Care | Payer: Medicare Other | Source: Ambulatory Visit | Attending: Surgery | Admitting: Surgery

## 2019-10-22 DIAGNOSIS — D0512 Intraductal carcinoma in situ of left breast: Secondary | ICD-10-CM

## 2019-10-22 DIAGNOSIS — N644 Mastodynia: Secondary | ICD-10-CM | POA: Diagnosis not present

## 2019-10-22 DIAGNOSIS — R922 Inconclusive mammogram: Secondary | ICD-10-CM

## 2019-10-22 DIAGNOSIS — R9230 Dense breasts, unspecified: Secondary | ICD-10-CM

## 2019-10-22 MED ORDER — GADOBUTROL 1 MMOL/ML IV SOLN
5.0000 mL | Freq: Once | INTRAVENOUS | Status: AC | PRN
  Administered 2019-10-22: 5 mL via INTRAVENOUS

## 2019-11-02 DIAGNOSIS — D0512 Intraductal carcinoma in situ of left breast: Secondary | ICD-10-CM | POA: Diagnosis not present

## 2020-03-21 ENCOUNTER — Ambulatory Visit (INDEPENDENT_AMBULATORY_CARE_PROVIDER_SITE_OTHER): Payer: Medicare Other

## 2020-03-21 ENCOUNTER — Other Ambulatory Visit: Payer: Self-pay | Admitting: Internal Medicine

## 2020-03-21 ENCOUNTER — Other Ambulatory Visit: Payer: Self-pay

## 2020-03-21 ENCOUNTER — Telehealth: Payer: Medicare Other

## 2020-03-21 DIAGNOSIS — Z0000 Encounter for general adult medical examination without abnormal findings: Secondary | ICD-10-CM | POA: Diagnosis not present

## 2020-03-21 NOTE — Patient Instructions (Signed)
Rhonda Reynolds , Thank you for taking time to come for your Medicare Wellness Visit. I appreciate your ongoing commitment to your health goals. Please review the following plan we discussed and let me know if I can assist you in the future.   Screening recommendations/referrals: Colonoscopy: no repeat due to age Mammogram: 08/27/2018 Bone Density: 01/19/2019 Recommended yearly ophthalmology/optometry visit for glaucoma screening and checkup Recommended yearly dental visit for hygiene and checkup  Vaccinations: Influenza vaccine: declined Pneumococcal vaccine: up to date Tdap vaccine: 04/20/2013; due every 10 years Shingles vaccine: never done   Covid-19: up to date  Advanced directives: Please bring a copy of your health care power of attorney and living will to the office at your convenience.  Conditions/risks identified: Yes; Reviewed health maintenance screenings with patient today and relevant education, vaccines, and/or referrals were provided. Please continue to do your personal lifestyle choices by: daily care of teeth and gums, regular physical activity (goal should be 5 days a week for 30 minutes), eat a healthy diet, avoid tobacco and drug use, limiting any alcohol intake, taking a low-dose aspirin (if not allergic or have been advised by your provider otherwise) and taking vitamins and minerals as recommended by your provider. Continue doing brain stimulating activities (puzzles, reading, adult coloring books, staying active) to keep memory sharp. Continue to eat heart healthy diet (full of fruits, vegetables, whole grains, lean protein, water--limit salt, fat, and sugar intake) and increase physical activity as tolerated.  Next appointment: Please schedule your next Medicare Wellness Visit with your Nurse Health Advisor in 1 year by calling [phone].   Preventive Care 15 Years and Older, Female Preventive care refers to lifestyle choices and visits with your health care provider  that can promote health and wellness. What does preventive care include?  A yearly physical exam. This is also called an annual well check.  Dental exams once or twice a year.  Routine eye exams. Ask your health care provider how often you should have your eyes checked.  Personal lifestyle choices, including:  Daily care of your teeth and gums.  Regular physical activity.  Eating a healthy diet.  Avoiding tobacco and drug use.  Limiting alcohol use.  Practicing safe sex.  Taking low-dose aspirin every day.  Taking vitamin and mineral supplements as recommended by your health care provider. What happens during an annual well check? The services and screenings done by your health care provider during your annual well check will depend on your age, overall health, lifestyle risk factors, and family history of disease. Counseling  Your health care provider may ask you questions about your:  Alcohol use.  Tobacco use.  Drug use.  Emotional well-being.  Home and relationship well-being.  Sexual activity.  Eating habits.  History of falls.  Memory and ability to understand (cognition).  Work and work Astronomer.  Reproductive health. Screening  You may have the following tests or measurements:  Height, weight, and BMI.  Blood pressure.  Lipid and cholesterol levels. These may be checked every 5 years, or more frequently if you are over 10 years old.  Skin check.  Lung cancer screening. You may have this screening every year starting at age 55 if you have a 30-pack-year history of smoking and currently smoke or have quit within the past 15 years.  Fecal occult blood test (FOBT) of the stool. You may have this test every year starting at age 28.  Flexible sigmoidoscopy or colonoscopy. You may have a sigmoidoscopy every 5  years or a colonoscopy every 10 years starting at age 84.  Hepatitis C blood test.  Hepatitis B blood test.  Sexually transmitted  disease (STD) testing.  Diabetes screening. This is done by checking your blood sugar (glucose) after you have not eaten for a while (fasting). You may have this done every 1-3 years.  Bone density scan. This is done to screen for osteoporosis. You may have this done starting at age 60.  Mammogram. This may be done every 1-2 years. Talk to your health care provider about how often you should have regular mammograms. Talk with your health care provider about your test results, treatment options, and if necessary, the need for more tests. Vaccines  Your health care provider may recommend certain vaccines, such as:  Influenza vaccine. This is recommended every year.  Tetanus, diphtheria, and acellular pertussis (Tdap, Td) vaccine. You may need a Td booster every 10 years.  Zoster vaccine. You may need this after age 46.  Pneumococcal 13-valent conjugate (PCV13) vaccine. One dose is recommended after age 47.  Pneumococcal polysaccharide (PPSV23) vaccine. One dose is recommended after age 52. Talk to your health care provider about which screenings and vaccines you need and how often you need them. This information is not intended to replace advice given to you by your health care provider. Make sure you discuss any questions you have with your health care provider. Document Released: 04/08/2015 Document Revised: 11/30/2015 Document Reviewed: 01/11/2015 Elsevier Interactive Patient Education  2017 [place] Prevention in the Home Falls can cause injuries. They can happen to people of all ages. There are many things you can do to make your home safe and to help prevent falls. What can I do on the outside of my home?  Regularly fix the edges of walkways and driveways and fix any cracks.  Remove anything that might make you trip as you walk through a door, such as a raised step or threshold.  Trim any bushes or trees on the path to your home.  Use bright outdoor  lighting.  Clear any walking paths of anything that might make someone trip, such as rocks or tools.  Regularly check to see if handrails are loose or broken. Make sure that both sides of any steps have handrails.  Any raised decks and porches should have guardrails on the edges.  Have any leaves, snow, or ice cleared regularly.  Use sand or salt on walking paths during winter.  Clean up any spills in your garage right away. This includes oil or grease spills. What can I do in the bathroom?  Use night lights.  Install grab bars by the toilet and in the tub and shower. Do not use towel bars as grab bars.  Use non-skid mats or decals in the tub or shower.  If you need to sit down in the shower, use a plastic, non-slip stool.  Keep the floor dry. Clean up any water that spills on the floor as soon as it happens.  Remove soap buildup in the tub or shower regularly.  Attach bath mats securely with double-sided non-slip rug tape.  Do not have throw rugs and other things on the floor that can make you trip. What can I do in the bedroom?  Use night lights.  Make sure that you have a light by your bed that is easy to reach.  Do not use any sheets or blankets that are too big for your bed. They should not hang  down onto the floor.  Have a firm chair that has side arms. You can use this for support while you get dressed.  Do not have throw rugs and other things on the floor that can make you trip. What can I do in the kitchen?  Clean up any spills right away.  Avoid walking on wet floors.  Keep items that you use a lot in easy-to-reach places.  If you need to reach something above you, use a strong step stool that has a grab bar.  Keep electrical cords out of the way.  Do not use floor polish or wax that makes floors slippery. If you must use wax, use non-skid floor wax.  Do not have throw rugs and other things on the floor that can make you trip. What can I do with my  stairs?  Do not leave any items on the stairs.  Make sure that there are handrails on both sides of the stairs and use them. Fix handrails that are broken or loose. Make sure that handrails are as long as the stairways.  Check any carpeting to make sure that it is firmly attached to the stairs. Fix any carpet that is loose or worn.  Avoid having throw rugs at the top or bottom of the stairs. If you do have throw rugs, attach them to the floor with carpet tape.  Make sure that you have a light switch at the top of the stairs and the bottom of the stairs. If you do not have them, ask someone to add them for you. What else can I do to help prevent falls?  Wear shoes that:  Do not have high heels.  Have rubber bottoms.  Are comfortable and fit you well.  Are closed at the toe. Do not wear sandals.  If you use a stepladder:  Make sure that it is fully opened. Do not climb a closed stepladder.  Make sure that both sides of the stepladder are locked into place.  Ask someone to hold it for you, if possible.  Clearly mark and make sure that you can see:  Any grab bars or handrails.  First and last steps.  Where the edge of each step is.  Use tools that help you move around (mobility aids) if they are needed. These include:  Canes.  Walkers.  Scooters.  Crutches.  Turn on the lights when you go into a dark area. Replace any light bulbs as soon as they burn out.  Set up your furniture so you have a clear path. Avoid moving your furniture around.  If any of your floors are uneven, fix them.  If there are any pets around you, be aware of where they are.  Review your medicines with your doctor. Some medicines can make you feel dizzy. This can increase your chance of falling. Ask your doctor what other things that you can do to help prevent falls. This information is not intended to replace advice given to you by your health care provider. Make sure you discuss any  questions you have with your health care provider. Document Released: 01/06/2009 Document Revised: 08/18/2015 Document Reviewed: 04/16/2014 Elsevier Interactive Patient Education  2017 [name].

## 2020-03-21 NOTE — Progress Notes (Signed)
I connected with Rhonda Reynolds today by telephone and verified that I am speaking with the correct person using two identifiers. Location patient: home Location provider: work Persons participating in the virtual visit: Michala Deblanc and Lisette Abu, LPN.   I discussed the limitations, risks, security and privacy concerns of performing an evaluation and management service by telephone and the availability of in person appointments. I also discussed with the patient that there may be a patient responsible charge related to this service. The patient expressed understanding and verbally consented to this telephonic visit.    Interactive audio and video telecommunications were attempted between this provider and patient, however failed, due to patient having technical difficulties OR patient did not have access to video capability.  We continued and completed visit with audio only.  Some vital signs may be absent or patient reported.   Time Spent with patient on telephone encounter: 30 minutes  Subjective:   Rhonda Reynolds is a 85 y.o. female who presents for Medicare Annual (Subsequent) preventive examination.  Review of Systems    No ROS. Medicare Wellness Visit. Additional risk factors are reflected in social history. Cardiac Risk Factors include: advanced age (>66men, >53 women);diabetes mellitus;family history of premature cardiovascular disease;hypertension;dyslipidemia     Objective:    There were no vitals filed for this visit. There is no height or weight on file to calculate BMI.  Advanced Directives 03/21/2020 02/25/2019 05/29/2017 05/13/2017 03/02/2013  Does Patient Have a Medical Advance Directive? Yes Yes Yes Yes Patient has advance directive, copy not in chart  Type of Advance Directive Living will;Healthcare Power of [place];Living will [place];Living will [place];Living will Living  will;Healthcare Power of Attorney  Does patient want to make changes to medical advance directive? No - Patient declined - - - -  Copy of [place] in Chart? No - copy requested No - copy requested No - copy requested No - copy requested -    Current Medications (verified) Outpatient Encounter Medications as of 03/21/2020  Medication Sig  . amLODipine (NORVASC) 5 MG tablet TAKE 1 TABLET(5 MG) BY MOUTH DAILY  . aspirin EC 81 MG tablet Take 81 mg by mouth daily.  . carboxymethylcellulose (REFRESH PLUS) 0.5 % SOLN 1 drop 3 (three) times daily as needed (DRY EYE).   . cholecalciferol (VITAMIN D) 1000 units tablet Take 3 tablets (3,000 Units total) by mouth daily.  . cycloSPORINE (RESTASIS) 0.05 % ophthalmic emulsion Place 1 drop into both eyes 2 (two) times daily.  Marland Kitchen guaiFENesin (MUCINEX) 600 MG 12 hr tablet Take by mouth.  . tiotropium (SPIRIVA HANDIHALER) 18 MCG inhalation capsule INHALE THE CONTENTS OF 1 CAPSULE VIA INHALATION DEVICE EVERY DAY   No facility-administered encounter medications on file as of 03/21/2020.    Allergies (verified) Forteo [parathyroid hormone (recomb)], Other, Pseudoephedrine hcl, and Fosamax [alendronate sodium]   History: Past Medical History:  Diagnosis Date  . Arthritis   . Breast cancer ([place])    left  . Diabetes mellitus without complication ([place])   . GERD (gastroesophageal reflux disease)    occ  . History of radiation therapy 04/15/13-05/12/13   42.5 gray to left breast. lumpectomy cavity to 50 gray  . Hyperlipidemia   . Hypertension   . Hyperthyroidism   . Left fibular fracture 10/29/2016  . Osteoporosis   . Personal history of radiation therapy 2015   Past Surgical History:  Procedure Laterality Date  . ABDOMINAL  HYSTERECTOMY    . BREAST LUMPECTOMY Left 03/04/2013  . BREAST LUMPECTOMY WITH NEEDLE LOCALIZATION Left 03/04/2013   Procedure: LEFT BREAST LUMPECTOMY WITH NEEDLE LOCALIZATION;  Surgeon: Imogene Burn. Georgette Dover, MD;   Location: [facility];  Service: General;  Laterality: Left;  . BREAST SURGERY    . EYE SURGERY Bilateral 14  . SPINE SURGERY  79   Family History  Problem Relation Age of Onset  . Cancer Mother        pt canot remember  . Diabetes Mother   . Stroke Mother   . Heart failure Father   . Lung cancer Brother    Social History   Socioeconomic History  . Marital status: Widowed    Spouse name: Not on file  . Number of children: 4  . Years of education: Not on file  . Highest education level: Not on file  Occupational History  . Occupation: retired  Tobacco Use  . Smoking status: Current Every Day Smoker    Packs/day: 0.50    Years: 40.00    Pack years: 20.00    Types: Cigarettes  . Smokeless tobacco: Never Used  Vaping Use  . Vaping Use: Never used  Substance and Sexual Activity  . Alcohol use: No  . Drug use: No  . Sexual activity: Never  Other Topics Concern  . Not on file  Social History Narrative   Widowed   Retired Control and instrumentation engineer   Social Determinants of Health   Financial Resource Strain: [place]   . Difficulty of Paying Living Expenses: Not hard at all  Food Insecurity: No Food Insecurity  . Worried About Charity fundraiser in the Last Year: Never true  . Ran Out of Food in the Last Year: Never true  Transportation Needs: No Transportation Needs  . Lack of Transportation (Medical): No  . Lack of Transportation (Non-Medical): No  Physical Activity: Sufficiently Active  . Days of Exercise per Week: 5 days  . Minutes of Exercise per Session: 30 min  Stress: No Stress Concern Present  . Feeling of Stress : Not at all  Social Connections: Moderately Integrated  . Frequency of Communication with Friends and Family: More than three times a week  . Frequency of Social Gatherings with Friends and Family: More than three times a week  . Attends Religious Services: More than 4 times per year  . Active Member of Clubs or Organizations: Yes  . Attends Theatre manager Meetings: More than 4 times per year  . Marital Status: Widowed    Tobacco Counseling Ready to quit: Not Answered Counseling given: Not Answered   Clinical Intake:  Pre-visit preparation completed: Yes  Pain : No/denies pain     Nutritional Risks: None Diabetes: No  How often do you need to have someone help you when you read instructions, pamphlets, or other written materials from your doctor or pharmacy?: 1 - Never What is the last grade level you completed in school?: High School Graduate  Diabetic? yes  Interpreter Needed?: No  Information entered by :: Lisette Abu, LPN   Activities of Daily Living In your present state of health, do you have any difficulty performing the following activities: 03/21/2020  Hearing? N  Vision? N  Difficulty concentrating or making decisions? N  Walking or climbing stairs? N  Dressing or bathing? N  Doing errands, shopping? N  Preparing Food and eating ? N  Using the Toilet? N  In the past six months, have you  accidently leaked urine? Y  Do you have problems with loss of bowel control? N  Managing your Medications? N  Managing your Finances? N  Housekeeping or managing your Housekeeping? N  Some recent data might be hidden    Patient Care Team: Pincus Sanes, MD as PCP - General (Internal Medicine) Dimitri Ped, MD as Consulting Physician (Surgery) Nahser, Deloris Ping, MD as Consulting Physician (Cardiology) Osborn Coho, MD as Consulting Physician (Otolaryngology)  Indicate any recent Medical Services you may have received from other than [facility] providers in the past year (date may be approximate).     Assessment:   This is a routine wellness examination for [place].  Hearing/Vision screen No exam data present  Dietary issues and exercise activities discussed: Current Exercise Habits: Home exercise routine, Type of exercise: walking, Time (Minutes): 30, Frequency (Times/Week): 5, Weekly  Exercise (Minutes/Week): 150, Intensity: Moderate, Exercise limited by: cardiac condition(s);respiratory conditions(s);orthopedic condition(s)  Goals    . Patient Stated     Stay as healthy and as independent as possible by monitoring my diet for sugar and carbohydrates, increasing my physical activity by looking into the [facility], and drink more water.     . Patient Stated     I want to start to sew again, I will try store reading glasses to help me see better.       Depression Screen PHQ 2/9 Scores 03/21/2020 02/25/2019 09/17/2018 05/29/2017 05/13/2017 01/18/2017 12/19/2015  PHQ - 2 Score 0 0 0 0 0 0 0  PHQ- 9 Score - - - - 3 - -    Fall Risk Fall Risk  03/21/2020 02/25/2019 02/25/2019 09/17/2018 05/29/2017  Falls in the past year? 0 1 0 1 Yes  Number falls in past yr: 0 0 0 1 2 or more  Injury with Fall? 0 0 - 0 Yes  Risk for fall due to : No Fall Risks Impaired balance/gait;History of fall(s) - - Impaired balance/gait  Follow up Falls evaluation completed Falls prevention discussed - - Falls prevention discussed    FALL RISK PREVENTION PERTAINING TO THE HOME:  Any stairs in or around the home? No  If so, are there any without handrails? No  Home free of loose throw rugs in walkways, pet beds, electrical cords, etc? Yes  Adequate lighting in your home to reduce risk of falls? Yes   ASSISTIVE DEVICES UTILIZED TO PREVENT FALLS:  Life alert? No  Use of a cane, walker or w/c? No  Grab bars in the bathroom? Yes  Shower chair or bench in shower? No  Elevated toilet seat or a handicapped toilet? Yes   TIMED UP AND GO:  Was the test performed? No .  Length of time to ambulate 10 feet: 0 sec.   Gait steady and fast without use of assistive device  Cognitive Function: MMSE - Mini Mental State Exam 05/13/2017  Orientation to time 5  Orientation to Place 5  Registration 3  Attention/ Calculation 5  Recall 1  Language- name 2 objects 2  Language- repeat 1  Language- follow 3  step command 3  Language- read & follow direction 1  Write a sentence 1  Copy design 1  Total score 28     6CIT Screen 02/25/2019  What Year? 0 points  What month? 0 points  What time? 0 points  Count back from 20 0 points  Months in reverse 0 points  Repeat phrase 0 points  Total Score 0    Immunizations  Immunization History  Administered Date(s) Administered  . Fluad Quad(high Dose 65+) 01/14/2019  . Influenza, High Dose Seasonal PF 12/19/2015, 01/09/2017, 12/30/2017  . Pneumococcal Conjugate-13 02/02/2014  . Pneumococcal Polysaccharide-23 12/10/2001  . Tdap 04/20/2013    TDAP status: Up to date  Flu Vaccine status: Declined, Education has been provided regarding the importance of this vaccine but patient still declined. Advised may receive this vaccine at local pharmacy or Health Dept. Aware to provide a copy of the vaccination record if obtained from local pharmacy or Health Dept. Verbalized acceptance and understanding.  Pneumococcal vaccine status: Up to date  Covid-19 vaccine status: Completed vaccines  Qualifies for Shingles Vaccine? Yes   Zostavax completed No   Shingrix Completed?: No.    Education has been provided regarding the importance of this vaccine. Patient has been advised to call insurance company to determine out of pocket expense if they have not yet received this vaccine. Advised may also receive vaccine at local pharmacy or Health Dept. Verbalized acceptance and understanding.  Screening Tests Health Maintenance  Topic Date Due  . COVID-19 Vaccine (1) Never done  . OPHTHALMOLOGY EXAM  04/30/2019  . FOOT EXAM  09/17/2019  . INFLUENZA VACCINE  10/25/2019  . URINE MICROALBUMIN  02/25/2020  . DEXA SCAN  01/18/2021  . TETANUS/TDAP  04/21/2023  . PNA vac Low Risk Adult  Completed    Health Maintenance  Health Maintenance Due  Topic Date Due  . COVID-19 Vaccine (1) Never done  . OPHTHALMOLOGY EXAM  04/30/2019  . FOOT EXAM  09/17/2019  .  INFLUENZA VACCINE  10/25/2019  . URINE MICROALBUMIN  02/25/2020    Colorectal cancer screening: No longer required.   Mammogram status: Completed 08/27/2018. Repeat every year  Bone Density status: Completed 01/19/2019. Results reflect: Bone density results: OSTEOPOROSIS. Repeat every 2 years.  Lung Cancer Screening: (Low Dose CT Chest recommended if Age 2-80 years, 30 pack-year currently smoking OR have quit w/in 15years.) does not qualify.   Lung Cancer Screening Referral: no  Additional Screening:  Hepatitis C Screening: does not qualify; Completed no  Vision Screening: Recommended annual ophthalmology exams for early detection of glaucoma and other disorders of the eye. Is the patient up to date with their annual eye exam?  Yes  Who is the provider or what is the name of the office in which the patient attends annual eye exams? Mateo Flow, MD. If pt is not established with a provider, would they like to be referred to a provider to establish care? No .   Dental Screening: Recommended annual dental exams for proper oral hygiene  Community Resource Referral / Chronic Care Management: CRR required this visit?  No   CCM required this visit?  No      Plan:     I have personally reviewed and noted the following in the patient's chart:   . Medical and social history . Use of alcohol, tobacco or illicit drugs  . Current medications and supplements . Functional ability and status . Nutritional status . Physical activity . Advanced directives . List of other physicians . Hospitalizations, surgeries, and ER visits in previous 12 months . Vitals . Screenings to include cognitive, depression, and falls . Referrals and appointments  In addition, I have reviewed and discussed with patient certain preventive protocols, quality metrics, and best practice recommendations. A written personalized care plan for preventive services as well as general preventive health  recommendations were provided to patient.     Brand Males  Reymundo Poll, LPN   30/16/0109   Nurse Notes:  Patient is cogitatively intact. There were no vitals filed for this visit. There is no height or weight on file to calculate BMI. Patient stated that she has no issues with gait or balance; does not use any assistive devices.

## 2020-06-17 DIAGNOSIS — H26493 Other secondary cataract, bilateral: Secondary | ICD-10-CM | POA: Diagnosis not present

## 2020-06-17 DIAGNOSIS — R7309 Other abnormal glucose: Secondary | ICD-10-CM | POA: Diagnosis not present

## 2020-06-17 DIAGNOSIS — H04123 Dry eye syndrome of bilateral lacrimal glands: Secondary | ICD-10-CM | POA: Diagnosis not present

## 2020-06-17 DIAGNOSIS — H524 Presbyopia: Secondary | ICD-10-CM | POA: Diagnosis not present

## 2020-06-17 DIAGNOSIS — H40023 Open angle with borderline findings, high risk, bilateral: Secondary | ICD-10-CM | POA: Diagnosis not present

## 2020-06-17 LAB — HM DIABETES EYE EXAM

## 2020-06-19 ENCOUNTER — Encounter: Payer: Self-pay | Admitting: Internal Medicine

## 2020-06-28 DIAGNOSIS — H903 Sensorineural hearing loss, bilateral: Secondary | ICD-10-CM | POA: Diagnosis not present

## 2020-06-28 DIAGNOSIS — Z974 Presence of external hearing-aid: Secondary | ICD-10-CM | POA: Diagnosis not present

## 2020-06-28 DIAGNOSIS — F17200 Nicotine dependence, unspecified, uncomplicated: Secondary | ICD-10-CM | POA: Diagnosis not present

## 2020-08-16 ENCOUNTER — Other Ambulatory Visit: Payer: Self-pay

## 2020-08-16 ENCOUNTER — Emergency Department (HOSPITAL_COMMUNITY)
Admission: EM | Admit: 2020-08-16 | Discharge: 2020-08-16 | Disposition: A | Discharge status: Home / Self Care | Payer: Medicare Other | Attending: Emergency Medicine | Admitting: Emergency Medicine

## 2020-08-16 ENCOUNTER — Encounter (HOSPITAL_COMMUNITY): Payer: Self-pay | Admitting: Emergency Medicine

## 2020-08-16 DIAGNOSIS — M545 Low back pain, unspecified: Secondary | ICD-10-CM | POA: Diagnosis not present

## 2020-08-16 DIAGNOSIS — X500XXA Overexertion from strenuous movement or load, initial encounter: Secondary | ICD-10-CM | POA: Diagnosis not present

## 2020-08-16 DIAGNOSIS — Z5321 Procedure and treatment not carried out due to patient leaving prior to being seen by health care provider: Secondary | ICD-10-CM | POA: Diagnosis not present

## 2020-08-16 NOTE — ED Notes (Signed)
Patient family member states she is taking her home

## 2020-08-16 NOTE — ED Triage Notes (Signed)
Pt c/o lower back pain x 2 days. States pain started after trying to lift a table leg. Pain worse with movement. A&O x 4.

## 2020-08-18 ENCOUNTER — Encounter: Payer: Self-pay | Admitting: Internal Medicine

## 2020-08-18 NOTE — Progress Notes (Signed)
Subjective:    Patient ID: Rhonda Reynolds, female    DOB: 03-21-1935, 86 y.o.   MRN: [id number]  HPI The patient is here for an acute visit.  She is here with her niece.   Back pain -  It started about one week ago.  She lifted a table and injured her back.  The pain is in the lower back.  She is wearing a brace which helps.  When she moves the back pain is worse and she feels pain in the stomach.  Her pain is a 8.5.  She has not taken anything for it.  No radiation of pain.  No n/t.  No leg weakness.    She has very poor nutrition and can not chew anything.  She is eating a lot of soup.   She will be moving to [place] next Friday for awhile - living with her daughter for a while. She will have some of her teeth removed and she will be living with her daughter while she recovers.  She will be moving back to the area.     Medications and allergies reviewed with patient and updated if appropriate.  Patient Active Problem List   Diagnosis Date Noted  . COPD with emphysema ([place]) 02/25/2019  . Chronic pancreatitis ([place]) 01/09/2017  . Abnormal CT of the chest 10/04/2016  . Ovarian cyst 10/04/2016  . Tortuous aorta ([place]) 08/30/2016  . Abdominal pain 08/09/2016  . Diabetes mellitus without complication ([place]) 21/19/4174  . Protein-calorie malnutrition ([place]) 12/19/2015  . Sensorineural hearing loss of both ears 12/19/2015  . Weight loss 12/19/2015  . Tobacco use disorder 12/19/2015  . History of hyperthyroidism 12/19/2015  . Essential hypertension 12/18/2015  . PAD (peripheral artery disease) ([place]) 12/18/2015  . Osteoporosis 12/18/2015  . History of left breast cancer 12/18/2015  . Neoplasm of left breast, primary tumor staging category Tis: ductal carcinoma in situ (DCIS) 03/25/2013    Current Outpatient Medications on File Prior to Visit  Medication Sig Dispense Refill  . amLODipine (NORVASC) 5 MG tablet TAKE 1 TABLET(5 MG) BY MOUTH DAILY 90 tablet 1  . aspirin EC 81 MG tablet  Take 81 mg by mouth daily.    . carboxymethylcellulose (REFRESH PLUS) 0.5 % SOLN 1 drop 3 (three) times daily as needed (DRY EYE).     . cholecalciferol (VITAMIN D) 1000 units tablet Take 3 tablets (3,000 Units total) by mouth daily. 90 tablet 1  . cycloSPORINE (RESTASIS) 0.05 % ophthalmic emulsion Place 1 drop into both eyes 2 (two) times daily. 0.4 mL 5  . guaiFENesin (MUCINEX) 600 MG 12 hr tablet Take by mouth.    . SPIRIVA HANDIHALER 18 MCG inhalation capsule INHALE THE CONTENTS OF 1 CAPSULE VIA INHALATION DEVICE EVERY DAY 30 capsule 11  . MODERNA COVID-19 VACCINE 100 MCG/0.5ML injection      No current facility-administered medications on file prior to visit.    Past Medical History:  Diagnosis Date  . Arthritis   . Breast cancer ([place])    left  . Diabetes mellitus without complication ([place])   . GERD (gastroesophageal reflux disease)    occ  . History of radiation therapy 04/15/13-05/12/13   42.5 gray to left breast. lumpectomy cavity to 50 gray  . Hyperlipidemia   . Hypertension   . Hyperthyroidism   . Left fibular fracture 10/29/2016  . Osteoporosis   . Personal history of radiation therapy 2015    Past Surgical History:  Procedure Laterality Date  .  ABDOMINAL HYSTERECTOMY    . BREAST LUMPECTOMY Left 03/04/2013  . BREAST LUMPECTOMY WITH NEEDLE LOCALIZATION Left 03/04/2013   Procedure: LEFT BREAST LUMPECTOMY WITH NEEDLE LOCALIZATION;  Surgeon: Imogene Burn. Georgette Dover, MD;  Location: [facility];  Service: General;  Laterality: Left;  . BREAST SURGERY    . EYE SURGERY Bilateral 14  . SPINE SURGERY  36    Social History   Socioeconomic History  . Marital status: Widowed    Spouse name: Not on file  . Number of children: 4  . Years of education: Not on file  . Highest education level: Not on file  Occupational History  . Occupation: retired  Tobacco Use  . Smoking status: Current Every Day Smoker    Packs/day: 0.50    Years: 40.00    Pack years: 20.00    Types: Cigarettes  .  Smokeless tobacco: Never Used  Vaping Use  . Vaping Use: Never used  Substance and Sexual Activity  . Alcohol use: No  . Drug use: No  . Sexual activity: Never  Other Topics Concern  . Not on file  Social History Narrative   Widowed   Retired Control and instrumentation engineer   Social Determinants of Health   Financial Resource Strain: [place]   . Difficulty of Paying Living Expenses: Not hard at all  Food Insecurity: No Food Insecurity  . Worried About Charity fundraiser in the Last Year: Never true  . Ran Out of Food in the Last Year: Never true  Transportation Needs: No Transportation Needs  . Lack of Transportation (Medical): No  . Lack of Transportation (Non-Medical): No  Physical Activity: Sufficiently Active  . Days of Exercise per Week: 5 days  . Minutes of Exercise per Session: 30 min  Stress: No Stress Concern Present  . Feeling of Stress : Not at all  Social Connections: Moderately Integrated  . Frequency of Communication with Friends and Family: More than three times a week  . Frequency of Social Gatherings with Friends and Family: More than three times a week  . Attends Religious Services: More than 4 times per year  . Active Member of Clubs or Organizations: Yes  . Attends Archivist Meetings: More than 4 times per year  . Marital Status: Widowed    Family History  Problem Relation Age of Onset  . Cancer Mother        pt canot remember  . Diabetes Mother   . Stroke Mother   . Heart failure Father   . Lung cancer Brother     Review of Systems  Constitutional: Negative for fever.  Respiratory: Positive for cough and shortness of breath. Negative for wheezing.   Cardiovascular: Negative for chest pain, palpitations and leg swelling.  Gastrointestinal:       No gerd  Musculoskeletal: Positive for back pain.  Neurological: Positive for light-headedness. Negative for dizziness, numbness and headaches.       Objective:   Vitals:   08/19/20 0832  BP:  (!) 162/82  Pulse: 72  Temp: 98.3 F (36.8 C)  SpO2: 97%   BP Readings from Last 3 Encounters:  08/19/20 (!) 162/82  08/16/20 (!) 163/108  04/02/19 (!) 144/82   Wt Readings from Last 3 Encounters:  08/19/20 82 lb 9.6 oz (37.5 kg)  04/02/19 84 lb 12.8 oz (38.5 kg)  02/25/19 89 lb (40.4 kg)   Body mass index is 13.33 kg/m.   Physical Exam Constitutional:      General: She  is not in acute distress.    Comments: Very thin, frail appearing with temporal wasting  HENT:     Head: Normocephalic and atraumatic.  Eyes:     Conjunctiva/sclera: Conjunctivae normal.  Cardiovascular:     Rate and Rhythm: Regular rhythm. Tachycardia present.  Pulmonary:     Effort: Pulmonary effort is normal. No respiratory distress.     Breath sounds: No wheezing or rales.  Musculoskeletal:        General: Tenderness (Lower spine-she is wearing a brace so it is difficult to get an exact location.) present.     Cervical back: Neck supple. No tenderness.     Right lower leg: No edema.     Left lower leg: No edema.  Lymphadenopathy:     Cervical: No cervical adenopathy.  Skin:    General: Skin is warm and dry.  Neurological:     Mental Status: She is alert.     Sensory: No sensory deficit.     Motor: No weakness.     Gait: Gait abnormal (Unsteady secondary to spinal pain).            Assessment & Plan:    See Problem List for Assessment and Plan of chronic medical problems.    This visit occurred during the SARS-CoV-2 public health emergency.  Safety protocols were in place, including screening questions prior to the visit, additional usage of staff PPE, and extensive cleaning of exam room while observing appropriate contact time as indicated for disinfecting solutions.

## 2020-08-19 ENCOUNTER — Other Ambulatory Visit: Payer: Self-pay

## 2020-08-19 ENCOUNTER — Ambulatory Visit (INDEPENDENT_AMBULATORY_CARE_PROVIDER_SITE_OTHER): Payer: Medicare Other

## 2020-08-19 ENCOUNTER — Ambulatory Visit (INDEPENDENT_AMBULATORY_CARE_PROVIDER_SITE_OTHER): Payer: Medicare Other | Admitting: Internal Medicine

## 2020-08-19 ENCOUNTER — Telehealth: Payer: Self-pay

## 2020-08-19 VITALS — BP 162/82 | HR 72 | Temp 98.3°F | Ht 66.0 in | Wt 82.6 lb

## 2020-08-19 DIAGNOSIS — M810 Age-related osteoporosis without current pathological fracture: Secondary | ICD-10-CM | POA: Diagnosis not present

## 2020-08-19 DIAGNOSIS — J432 Centrilobular emphysema: Secondary | ICD-10-CM

## 2020-08-19 DIAGNOSIS — E46 Unspecified protein-calorie malnutrition: Secondary | ICD-10-CM

## 2020-08-19 DIAGNOSIS — M545 Low back pain, unspecified: Secondary | ICD-10-CM | POA: Diagnosis not present

## 2020-08-19 DIAGNOSIS — E119 Type 2 diabetes mellitus without complications: Secondary | ICD-10-CM | POA: Diagnosis not present

## 2020-08-19 DIAGNOSIS — M8008XA Age-related osteoporosis with current pathological fracture, vertebra(e), initial encounter for fracture: Secondary | ICD-10-CM

## 2020-08-19 DIAGNOSIS — I10 Essential (primary) hypertension: Secondary | ICD-10-CM | POA: Diagnosis not present

## 2020-08-19 DIAGNOSIS — M47814 Spondylosis without myelopathy or radiculopathy, thoracic region: Secondary | ICD-10-CM | POA: Diagnosis not present

## 2020-08-19 LAB — COMPREHENSIVE METABOLIC PANEL
ALT: 12 U/L (ref 0–35)
AST: 20 U/L (ref 0–37)
Albumin: 4.8 g/dL (ref 3.5–5.2)
Alkaline Phosphatase: 80 U/L (ref 39–117)
BUN: 24 mg/dL — ABNORMAL HIGH (ref 6–23)
CO2: 30 mEq/L (ref 19–32)
Calcium: 10.7 mg/dL — ABNORMAL HIGH (ref 8.4–10.5)
Chloride: 97 mEq/L (ref 96–112)
Creatinine, Ser: 0.71 mg/dL (ref 0.40–1.20)
GFR: 78.48 mL/min (ref >60.00)
Glucose, Bld: 160 mg/dL — ABNORMAL HIGH (ref 70–99)
Potassium: 4.2 mEq/L (ref 3.5–5.1)
Sodium: 140 mEq/L (ref 135–145)
Total Bilirubin: 0.8 mg/dL (ref 0.2–1.2)
Total Protein: 8.5 g/dL — ABNORMAL HIGH (ref 6.0–8.3)

## 2020-08-19 LAB — CBC WITH DIFFERENTIAL/PLATELET
Basophils Absolute: 0 10*3/uL (ref 0.0–0.1)
Basophils Relative: 0.4 % (ref 0.0–3.0)
Eosinophils Absolute: 0 10*3/uL (ref 0.0–0.7)
Eosinophils Relative: 0 % (ref 0.0–5.0)
HCT: 43.6 % (ref 36.0–46.0)
Hemoglobin: 14.8 g/dL (ref 12.0–15.0)
Lymphocytes Relative: 9.4 % — ABNORMAL LOW (ref 12.0–46.0)
Lymphs Abs: 1 10*3/uL (ref 0.7–4.0)
MCHC: 34 g/dL (ref 30.0–36.0)
MCV: 90.7 fl (ref 78.0–100.0)
Monocytes Absolute: 0.8 10*3/uL (ref 0.1–1.0)
Monocytes Relative: 6.9 % (ref 3.0–12.0)
Neutro Abs: 9.1 10*3/uL — ABNORMAL HIGH (ref 1.4–7.7)
Neutrophils Relative %: 83.3 % — ABNORMAL HIGH (ref 43.0–77.0)
Platelets: 213 10*3/uL (ref 150.0–400.0)
RBC: 4.81 Mil/uL (ref 3.87–5.11)
RDW: 13.7 % (ref 11.5–15.5)
WBC: 11 10*3/uL — ABNORMAL HIGH (ref 4.0–10.5)

## 2020-08-19 LAB — HEMOGLOBIN A1C: Hgb A1c MFr Bld: 6.6 % — ABNORMAL HIGH (ref 4.6–6.5)

## 2020-08-19 LAB — LIPID PANEL
Cholesterol: 164 mg/dL (ref 0–200)
HDL: 44.8 mg/dL (ref >39.00)
LDL Cholesterol: 105 mg/dL — ABNORMAL HIGH (ref 0–99)
NonHDL: 119.05
Total CHOL/HDL Ratio: 4
Triglycerides: 72 mg/dL (ref 0.0–149.0)
VLDL: 14.4 mg/dL (ref 0.0–40.0)

## 2020-08-19 LAB — VITAMIN D 25 HYDROXY (VIT D DEFICIENCY, FRACTURES): VITD: 38.72 ng/mL (ref 30.00–100.00)

## 2020-08-19 MED ORDER — SPIRIVA HANDIHALER 18 MCG IN CAPS: ORAL_CAPSULE | RESPIRATORY_TRACT | 11 refills | Status: DC

## 2020-08-19 MED ORDER — AMLODIPINE BESYLATE 5 MG PO TABS: ORAL_TABLET | ORAL | 1 refills | Status: DC

## 2020-08-19 NOTE — Assessment & Plan Note (Signed)
Chronic A lot of her poor nutrition is related to her poor dentition-she is having some of her teeth removed next week Stressed drinking more Ensure, boost or protein drinks in addition to some more soft foods Unable to refer to nutrition since she is moving out of the area, but staying with family, which may help her overall nutrition

## 2020-08-19 NOTE — Assessment & Plan Note (Signed)
Chronic Diet controlled Check A1c, lipid panel, CMP

## 2020-08-19 NOTE — Assessment & Plan Note (Signed)
Chronic States she is taking vitamin D daily-we will check level Was on Prolia, but has not been seen for over a year Will be getting x-rays to rule out fracture Will need to consider getting her back on Prolia once able

## 2020-08-19 NOTE — Telephone Encounter (Signed)
Noted - see result note

## 2020-08-19 NOTE — Telephone Encounter (Signed)
IMPRESSION: 1. Mild superior endplate depression of L1, new from prior. Correlate with point tenderness. Consider lumbar spine MRI to further assess the acuity of this finding. 2. Chronic pancreatitis.

## 2020-08-19 NOTE — Assessment & Plan Note (Signed)
Chronic Not controlled here today She has not been taking her medication-has not been filled since last summer Restart amlodipine 5 mg daily-most likely she will need a higher dose, but this will need to be titrated CMP

## 2020-08-19 NOTE — Patient Instructions (Addendum)
  Blood work was ordered.  Have xrays downstairs.     Medications changes include :   none  Your prescription(s) have been submitted to your pharmacy. Please take as directed and contact our office if you believe you are having problem(s) with the medication(s).    Please followup when you come back to town

## 2020-08-19 NOTE — Assessment & Plan Note (Signed)
Chronic Has shortness of breath with exertion Still smoking-stressed smoking cessation Continue Spiriva daily-she states this does help

## 2020-08-19 NOTE — Assessment & Plan Note (Signed)
Acute Started 1 week ago after lifting something Has osteoporosis Concern for lumbar fracture X-rays today She is not taking anything for the pain and does not feel that she needs anything at this time Advised to take Tylenol if needed and call if pain is not controlled

## 2020-08-25 ENCOUNTER — Ambulatory Visit: Payer: Medicare Other | Admitting: Specialist

## 2020-08-25 ENCOUNTER — Other Ambulatory Visit: Payer: Self-pay

## 2020-08-25 ENCOUNTER — Encounter: Payer: Self-pay | Admitting: Specialist

## 2020-08-25 ENCOUNTER — Other Ambulatory Visit: Payer: Self-pay | Admitting: Specialist

## 2020-08-25 VITALS — BP 129/87 | HR 74 | Ht 66.0 in | Wt 83.0 lb

## 2020-08-25 DIAGNOSIS — S32010A Wedge compression fracture of first lumbar vertebra, initial encounter for closed fracture: Secondary | ICD-10-CM

## 2020-08-25 DIAGNOSIS — M4856XA Collapsed vertebra, not elsewhere classified, lumbar region, initial encounter for fracture: Secondary | ICD-10-CM | POA: Diagnosis not present

## 2020-08-25 DIAGNOSIS — M8000XA Age-related osteoporosis with current pathological fracture, unspecified site, initial encounter for fracture: Secondary | ICD-10-CM

## 2020-08-25 MED ORDER — TRAMADOL-ACETAMINOPHEN 37.5-325 MG PO TABS: 1.0000 | ORAL_TABLET | Freq: Four times a day (QID) | ORAL | 0 refills | Status: DC | PRN

## 2020-08-25 MED ORDER — VITAMIN D (ERGOCALCIFEROL) 1.25 MG (50000 UNIT) PO CAPS: 50000.0000 [IU] | ORAL_CAPSULE | ORAL | 0 refills | Status: DC

## 2020-08-25 NOTE — Patient Instructions (Signed)
Avoid frequent bending and stooping  No lifting greater than 10 lbs. May use ice or moist heat for pain. Weight loss is of benefit. Best medication for a lumbar fracture is pain medications that relieve the pain. You have severe pathologic osteoporosis according to your bone density tests. Exercise is important to improve your indurance and does allow people to function better inspite of back pain. Ultracet for pain Ca is elevated as is total protein so assess for multiple myeloma with Sed rate and SPEP Needs endocrine or rheumatology consult if Dr. Quay Burow is not comfortable with work up of cause for severe osteoporosis.  She has a corset but it is a lumbar corset, at the upper lumbar level L1 a thoracolumbar corset or no brace at all if she is comfortable without a brace.

## 2020-08-25 NOTE — Progress Notes (Signed)
Office Visit Note   Patient: Rhonda Reynolds           Date of Birth: 09-29-1934           MRN: [id number] Visit Date: 08/25/2020              Requested by: Binnie Rail, MD [place],  [place] [address] PCP: Binnie Rail, MD   Assessment & Plan: Visit Diagnoses: No diagnosis found.  Plan: Avoid frequent bending and stooping  No lifting greater than 10 lbs. May use ice or moist heat for pain. Weight loss is of benefit. Best medication for a lumbar fracture is pain medications that relieve the pain. You have severe pathologic osteoporosis according to your bone density tests. Exercise is important to improve your indurance and does allow people to function better inspite of back pain. Ultracet for pain Ca is elevated as is total protein so assess for multiple myeloma with Sed rate and SPEP Needs endocrine or rheumatology consult if Dr. Quay Burow is not comfortable with work up of cause for severe osteoporosis.  She has a corset but it is a lumbar corset, at the upper lumbar level L1 a thoracolumbar corset or no brace at all if she is comfortable without a brace.  Follow-Up Instructions: No follow-ups on file.   Orders:  No orders of the defined types were placed in this encounter.  No orders of the defined types were placed in this encounter.     Procedures: No procedures performed   Clinical Data: No additional findings.   Subjective: Chief Complaint  Patient presents with  . Lower Back - Fracture    L1 vertebral fracture    86 year old female with a history of LBP since bending to put tin foil under the legs of a table after getting her carpet cleaned. This happened about 2 weeks ago on a Friday. She had sudden onset of pain into the back Not into the back but pain is into the back and will go into the stomach. No bowel or bladder difficulty. Bowels are stopped up. She is slowing down eating due to problems with teeth. She has decreased drink to  keep from going to the bathroom more often.    Review of Systems  Constitutional: Negative.  Negative for unexpected weight change (losing weight, lost about 5-10, normally 85-90 lbs.).  HENT: Positive for congestion, rhinorrhea, sinus pressure, sinus pain and sneezing.   Eyes: Negative.   Respiratory: Positive for cough, shortness of breath and wheezing.   Cardiovascular: Positive for palpitations. Negative for chest pain and leg swelling.  Gastrointestinal: Negative.  Negative for abdominal distention, abdominal pain, anal bleeding, blood in stool, constipation, diarrhea, nausea, rectal pain and vomiting.  Endocrine: Negative.   Genitourinary: Negative.   Musculoskeletal: Positive for back pain. Negative for arthralgias, gait problem, joint swelling, myalgias, neck pain and neck stiffness.  Skin: Negative.   Allergic/Immunologic: Negative.  Negative for environmental allergies, food allergies and immunocompromised state.  Neurological: Negative.  Negative for dizziness, tremors, seizures, syncope, facial asymmetry, speech difficulty, weakness, light-headedness, numbness and headaches.  Hematological: Negative.  Negative for adenopathy. Does not bruise/bleed easily.  Psychiatric/Behavioral: Negative.  Negative for agitation, behavioral problems, confusion, decreased concentration, dysphoric mood, hallucinations, self-injury, sleep disturbance and suicidal ideas. The patient is not nervous/anxious and is not hyperactive.      Objective: Vital Signs: BP 129/87 (BP Location: Left Arm, Patient Position: Sitting)   Pulse 74   Ht  5' 6"  (1.676 m)   Wt 83 lb (37.6 kg)   BMI 13.40 kg/m   Physical Exam Constitutional:      Appearance: She is well-developed.  HENT:     Head: Normocephalic and atraumatic.  Eyes:     Pupils: Pupils are equal, round, and reactive to light.  Pulmonary:     Effort: Pulmonary effort is normal.     Breath sounds: Normal breath sounds.  Abdominal:     General:  Bowel sounds are normal.     Palpations: Abdomen is soft.  Musculoskeletal:        General: Normal range of motion.     Cervical back: Normal range of motion and neck supple.  Skin:    General: Skin is warm and dry.  Neurological:     Mental Status: She is alert and oriented to person, place, and time.  Psychiatric:        Behavior: Behavior normal.        Thought Content: Thought content normal.        Judgment: Judgment normal.     Ortho Exam  Specialty Comments:  No specialty comments available.  Imaging: No results found.   PMFS History: Patient Active Problem List   Diagnosis Date Noted  . Acute midline low back pain without sciatica 08/19/2020  . COPD with emphysema ([place]) 02/25/2019  . Chronic pancreatitis ([place]) 01/09/2017  . Abnormal CT of the chest 10/04/2016  . Ovarian cyst 10/04/2016  . Tortuous aorta ([place]) 08/30/2016  . Abdominal pain 08/09/2016  . Diabetes mellitus without complication ([place]) 60/73/7106  . Protein-calorie malnutrition ([place]) 12/19/2015  . Sensorineural hearing loss of both ears 12/19/2015  . Weight loss 12/19/2015  . Tobacco use disorder 12/19/2015  . History of hyperthyroidism 12/19/2015  . Essential hypertension 12/18/2015  . PAD (peripheral artery disease) ([place]) 12/18/2015  . Osteoporosis 12/18/2015  . History of left breast cancer 12/18/2015  . Neoplasm of left breast, primary tumor staging category Tis: ductal carcinoma in situ (DCIS) 03/25/2013   Past Medical History:  Diagnosis Date  . Arthritis   . Breast cancer ([facility])    left  . Diabetes mellitus without complication ([place])   . GERD (gastroesophageal reflux disease)    occ  . History of radiation therapy 04/15/13-05/12/13   42.5 gray to left breast. lumpectomy cavity to 50 gray  . Hyperlipidemia   . Hypertension   . Hyperthyroidism   . Left fibular fracture 10/29/2016  . Osteoporosis   . Personal history of radiation therapy 2015    Family History  Problem Relation Age of  Onset  . Cancer Mother        pt canot remember  . Diabetes Mother   . Stroke Mother   . Heart failure Father   . Lung cancer Brother     Past Surgical History:  Procedure Laterality Date  . ABDOMINAL HYSTERECTOMY    . BREAST LUMPECTOMY Left 03/04/2013  . BREAST LUMPECTOMY WITH NEEDLE LOCALIZATION Left 03/04/2013   Procedure: LEFT BREAST LUMPECTOMY WITH NEEDLE LOCALIZATION;  Surgeon: Imogene Burn. Georgette Dover, MD;  Location: [facility];  Service: General;  Laterality: Left;  . BREAST SURGERY    . EYE SURGERY Bilateral 16  . SPINE SURGERY  65   Social History   Occupational History  . Occupation: retired  Tobacco Use  . Smoking status: Current Every Day Smoker    Packs/day: 0.50    Years: 40.00    Pack years: 20.00    Types: Cigarettes  .  Smokeless tobacco: Never Used  Vaping Use  . Vaping Use: Never used  Substance and Sexual Activity  . Alcohol use: No  . Drug use: No  . Sexual activity: Never

## 2020-09-08 ENCOUNTER — Ambulatory Visit
Admission: RE | Admit: 2020-09-08 | Discharge: 2020-09-08 | Disposition: A | Discharge status: Home / Self Care | Payer: Medicare Other | Source: Ambulatory Visit | Attending: Internal Medicine | Admitting: Internal Medicine

## 2020-09-08 DIAGNOSIS — M8008XA Age-related osteoporosis with current pathological fracture, vertebra(e), initial encounter for fracture: Secondary | ICD-10-CM

## 2020-09-08 DIAGNOSIS — M48061 Spinal stenosis, lumbar region without neurogenic claudication: Secondary | ICD-10-CM | POA: Diagnosis not present

## 2020-09-14 ENCOUNTER — Ambulatory Visit: Payer: Medicare Other | Admitting: Surgery

## 2020-09-14 ENCOUNTER — Ambulatory Visit: Payer: Self-pay

## 2020-09-14 ENCOUNTER — Encounter: Payer: Self-pay | Admitting: Surgery

## 2020-09-14 DIAGNOSIS — M4856XA Collapsed vertebra, not elsewhere classified, lumbar region, initial encounter for fracture: Secondary | ICD-10-CM

## 2020-09-29 ENCOUNTER — Telehealth: Payer: Self-pay | Admitting: Internal Medicine

## 2020-09-29 NOTE — Telephone Encounter (Signed)
   LEE KALT DOB: 01-07-1935 MRN: [id number]   RIDER WAIVER AND RELEASE OF LIABILITY  For purposes of improving physical access to our facilities, [facility] is pleased to partner with third parties to provide [facility] patients or other authorized individuals the option of convenient, on-demand ground transportation services (the Technical brewer") through use of the technology service that enables users to request on-demand ground transportation from independent third-party providers.  By opting to use and accept these [employer], I, the undersigned, hereby agree on behalf of myself, and on behalf of any minor child using the Government social research officer for whom I am the parent or legal guardian, as follows:  Government social research officer provided to me are provided by independent third-party transportation providers who are not [employer] or employees and who are unaffiliated with [employer]. [facility] is neither a transportation carrier nor a common or public carrier. [facility] has no control over the quality or safety of the transportation that occurs as a result of the [employer]. [facility] cannot guarantee that any third-party transportation provider will complete any arranged transportation service. [facility] makes no representation, warranty, or guarantee regarding the reliability, timeliness, quality, safety, suitability, or availability of any of the Transport Services or that they will be error free. I fully understand that traveling by vehicle involves risks and dangers of serious bodily injury, including permanent disability, paralysis, and death. I agree, on behalf of myself and on behalf of any minor child using the Transport Services for whom I am the parent or legal guardian, that the entire risk arising out of my use of the [employer] remains solely with me, to the maximum extent permitted under applicable law. The [employer] are provided "as  is" and "as available." [facility] disclaims all representations and warranties, express, implied or statutory, not expressly set out in these terms, including the implied warranties of merchantability and fitness for a particular purpose. I hereby waive and release [facility], its agents, employees, officers, directors, representatives, insurers, attorneys, assigns, successors, subsidiaries, and affiliates from any and all past, present, or future claims, demands, liabilities, actions, causes of action, or suits of any kind directly or indirectly arising from acceptance and use of the [employer]. I further waive and release [facility] and its affiliates from all present and future liability and responsibility for any injury or death to persons or damages to property caused by or related to the use of the [employer]. I have read this Waiver and Release of Liability, and I understand the terms used in it and their legal significance. This Waiver is freely and voluntarily given with the understanding that my right (as well as the right of any minor child for whom I am the parent or legal guardian using the [employer]) to legal recourse against [facility] in connection with the [employer] is knowingly surrendered in return for use of these services.   I attest that I read the consent document to [name], gave Ms. Speir the opportunity to ask questions and answered the questions asked (if any). I affirm that [name] then provided consent for she's participation in this program.     Katy Apo

## 2020-10-04 NOTE — Progress Notes (Signed)
Office Visit Note   Patient: Rhonda Reynolds           Date of Birth: 04-Apr-1934           MRN: [id number] Visit Date: 09/14/2020              Requested by: Binnie Rail, MD [place],   [id number] PCP: Binnie Rail, MD   Assessment & Plan: Visit Diagnoses:  1. Pathologic compression fracture of lumbar vertebra, initial encounter [facility])     Plan: Patient doing very well at this point.  I advised her that I still want her to be careful and avoid any aggressive activity or heavy lifting.  Follow with Dr. Louanne Skye in 3 weeks hopefully for final check.  Still no driving.  Follow-Up Instructions: Return in about 3 weeks (around 10/05/2020) for WITH DR [place].   Orders:  Orders Placed This Encounter  Procedures   XR Lumbar Spine 2-3 Views   No orders of the defined types were placed in this encounter.     Procedures: No procedures performed   Clinical Data: No additional findings.   Subjective: Chief Complaint  Patient presents with   Lower Back - Follow-up    L1 Fx    HPI 86 year old black female returns for follow-up of her L1 compression fracture.  States that she is doing well.  Not complaining of any pain. Review of Systems No current cardiac pulmonary GI GU issues  Objective: Vital Signs: There were no vitals taken for this visit.  Physical Exam HENT:     Head: Normocephalic and atraumatic.  Eyes:     Extraocular Movements: Extraocular movements intact.  Pulmonary:     Effort: No respiratory distress.  Musculoskeletal:     Comments: Low back nontender.  Neurological:     General: No focal deficit present.     Mental Status: She is alert and oriented to person, place, and time.  Psychiatric:        Mood and Affect: Mood normal.    Ortho Exam  Specialty Comments:  No specialty comments available.  Imaging: No results found.   PMFS History: Patient Active Problem List   Diagnosis Date  Noted   Acute midline low back pain without sciatica 08/19/2020   COPD with emphysema ([place]) 02/25/2019   Chronic pancreatitis ([place]) 01/09/2017   Abnormal CT of the chest 10/04/2016   Ovarian cyst 10/04/2016   Tortuous aorta ([place]) 08/30/2016   Abdominal pain 08/09/2016   Diabetes mellitus without complication ([facility]) 22/97/9892   Protein-calorie malnutrition ([place]) 12/19/2015   Sensorineural hearing loss of both ears 12/19/2015   Weight loss 12/19/2015   Tobacco use disorder 12/19/2015   History of hyperthyroidism 12/19/2015   Essential hypertension 12/18/2015   PAD (peripheral artery disease) ([facility]) 12/18/2015   Osteoporosis 12/18/2015   History of left breast cancer 12/18/2015   Neoplasm of left breast, primary tumor staging category Tis: ductal carcinoma in situ (DCIS) 03/25/2013   Past Medical History:  Diagnosis Date   Arthritis    Breast cancer ([place])    left   Diabetes mellitus without complication ([place])    GERD (gastroesophageal reflux disease)    occ   History of radiation therapy 04/15/13-05/12/13   42.5 gray to left breast. lumpectomy cavity to 50 gray   Hyperlipidemia    Hypertension    Hyperthyroidism    Left fibular fracture 10/29/2016   Osteoporosis    Personal history of  radiation therapy 2015    Family History  Problem Relation Age of Onset   Cancer Mother        pt canot remember   Diabetes Mother    Stroke Mother    Heart failure Father    Lung cancer Brother     Past Surgical History:  Procedure Laterality Date   ABDOMINAL HYSTERECTOMY     BREAST LUMPECTOMY Left 03/04/2013   BREAST LUMPECTOMY WITH NEEDLE LOCALIZATION Left 03/04/2013   Procedure: LEFT BREAST LUMPECTOMY WITH NEEDLE LOCALIZATION;  Surgeon: Imogene Burn. Georgette Dover, MD;  Location: [facility];  Service: General;  Laterality: Left;   BREAST SURGERY     EYE SURGERY Bilateral 16   SPINE SURGERY  89   Social History   Occupational History   Occupation: retired  Tobacco Use   Smoking status: Every Day     Packs/day: 0.50    Years: 40.00    Pack years: 20.00    Types: Cigarettes   Smokeless tobacco: Never  Vaping Use   Vaping Use: Never used  Substance and Sexual Activity   Alcohol use: No   Drug use: No   Sexual activity: Never

## 2020-10-07 ENCOUNTER — Ambulatory Visit: Payer: Medicare Other | Admitting: Specialist

## 2020-10-07 ENCOUNTER — Other Ambulatory Visit: Payer: Self-pay

## 2020-10-07 ENCOUNTER — Ambulatory Visit: Payer: Self-pay

## 2020-10-07 ENCOUNTER — Encounter: Payer: Self-pay | Admitting: Specialist

## 2020-10-07 VITALS — BP 168/93 | HR 123 | Ht 66.0 in | Wt 83.0 lb

## 2020-10-07 DIAGNOSIS — M4856XA Collapsed vertebra, not elsewhere classified, lumbar region, initial encounter for fracture: Secondary | ICD-10-CM | POA: Diagnosis not present

## 2020-10-07 NOTE — Progress Notes (Signed)
Office Visit Note   Patient: Rhonda Reynolds           Date of Birth: 1934-07-23           MRN: [id number] Visit Date: 10/07/2020              Requested by: Binnie Rail, MD [place],  [place] [address] PCP: Binnie Rail, MD   Assessment & Plan: Visit Diagnoses:  1. Pathologic compression fracture of lumbar vertebra, initial encounter [facility])     Plan: Avoid frequent bending and stooping  No lifting greater than 10 lbs. May use ice or moist heat for pain. Exercise is important to improve your indurance and does allow people to function better inspite of back pain.  Vitamin D supplements and use of calcium  Follow-Up Instructions: No follow-ups on file.   Orders:  Orders Placed This Encounter  Procedures   XR Lumbar Spine 2-3 Views   No orders of the defined types were placed in this encounter.     Procedures: No procedures performed   Clinical Data: No additional findings.   Subjective: Chief Complaint  Patient presents with   Lower Back - Fracture, Follow-up    HPI  Review of Systems   Objective: Vital Signs: BP (!) 168/93   Pulse (!) 123   Ht 5\' 6"  (1.676 m)   Wt 83 lb (37.6 kg)   BMI 13.40 kg/m   Physical Exam  Ortho Exam  Specialty Comments:  No specialty comments available.  Imaging: No results found.   PMFS History: Patient Active Problem List   Diagnosis Date Noted   Acute midline low back pain without sciatica 08/19/2020   COPD with emphysema ([place]) 02/25/2019   Chronic pancreatitis ([place]) 01/09/2017   Abnormal CT of the chest 10/04/2016   Ovarian cyst 10/04/2016   Tortuous aorta ([place]) 08/30/2016   Abdominal pain 08/09/2016   Diabetes mellitus without complication ([place]) 32/44/0102   Protein-calorie malnutrition ([place]) 12/19/2015   Sensorineural hearing loss of both ears 12/19/2015   Weight loss 12/19/2015   Tobacco use disorder 12/19/2015   History of hyperthyroidism 12/19/2015   Essential hypertension  12/18/2015   PAD (peripheral artery disease) ([place]) 12/18/2015   Osteoporosis 12/18/2015   History of left breast cancer 12/18/2015   Neoplasm of left breast, primary tumor staging category Tis: ductal carcinoma in situ (DCIS) 03/25/2013   Past Medical History:  Diagnosis Date   Arthritis    Breast cancer ([place])    left   Diabetes mellitus without complication ([facility])    GERD (gastroesophageal reflux disease)    occ   History of radiation therapy 04/15/13-05/12/13   42.5 gray to left breast. lumpectomy cavity to 50 gray   Hyperlipidemia    Hypertension    Hyperthyroidism    Left fibular fracture 10/29/2016   Osteoporosis    Personal history of radiation therapy 2015    Family History  Problem Relation Age of Onset   Cancer Mother        pt canot remember   Diabetes Mother    Stroke Mother    Heart failure Father    Lung cancer Brother     Past Surgical History:  Procedure Laterality Date   ABDOMINAL HYSTERECTOMY     BREAST LUMPECTOMY Left 03/04/2013   BREAST LUMPECTOMY WITH NEEDLE LOCALIZATION Left 03/04/2013   Procedure: LEFT BREAST LUMPECTOMY WITH NEEDLE LOCALIZATION;  Surgeon: Imogene Burn. Georgette Dover, MD;  Location: [facility];  Service: General;  Laterality: Left;   BREAST SURGERY     EYE SURGERY Bilateral 16   SPINE SURGERY  89   Social History   Occupational History   Occupation: retired  Tobacco Use   Smoking status: Every Day    Packs/day: 0.50    Years: 40.00    Pack years: 20.00    Types: Cigarettes   Smokeless tobacco: Never  Vaping Use   Vaping Use: Never used  Substance and Sexual Activity   Alcohol use: No   Drug use: No   Sexual activity: Never

## 2020-10-07 NOTE — Patient Instructions (Signed)
Plan: Avoid frequent bending and stooping  No lifting greater than 10 lbs. May use ice or moist heat for pain. Exercise is important to improve your indurance and does allow people to function better inspite of back pain. Vitamin D supplements and use of calcium

## 2020-11-10 ENCOUNTER — Ambulatory Visit: Payer: Medicare Other | Admitting: Specialist

## 2020-11-10 ENCOUNTER — Ambulatory Visit: Payer: Self-pay

## 2020-11-10 ENCOUNTER — Other Ambulatory Visit: Payer: Self-pay

## 2020-11-10 ENCOUNTER — Encounter: Payer: Self-pay | Admitting: Specialist

## 2020-11-10 VITALS — BP 186/101 | HR 116 | Ht 66.0 in | Wt 83.0 lb

## 2020-11-10 DIAGNOSIS — M8000XA Age-related osteoporosis with current pathological fracture, unspecified site, initial encounter for fracture: Secondary | ICD-10-CM | POA: Diagnosis not present

## 2020-11-10 DIAGNOSIS — S32010A Wedge compression fracture of first lumbar vertebra, initial encounter for closed fracture: Secondary | ICD-10-CM

## 2020-11-10 DIAGNOSIS — M4856XA Collapsed vertebra, not elsewhere classified, lumbar region, initial encounter for fracture: Secondary | ICD-10-CM | POA: Diagnosis not present

## 2020-11-10 NOTE — Progress Notes (Addendum)
Office Visit Note   Patient: Rhonda Reynolds           Date of Birth: 16-Apr-1934           MRN: [id number] Visit Date: 11/10/2020              Requested by: Binnie Rail, MD [place],  [place] [address] PCP: Binnie Rail, MD   Assessment & Plan: Visit Diagnoses:  1. Pathologic compression fracture of lumbar vertebra, initial encounter ([place])   2. Osteoporosis with current pathological fracture, unspecified osteoporosis type, initial encounter   3. Closed compression fracture of L1 vertebra, initial encounter [facility])     Plan: Fall Prevention and Home Safety Falls cause injuries and can affect all age groups. It is possible to use preventive measures to significantly decrease the likelihood of falls. There are many simple measures which can make your home safer and prevent falls. OUTDOORS Repair cracks and edges of walkways and driveways. Remove high doorway thresholds. Trim shrubbery on the main path into your home. Have good outside lighting. Clear walkways of tools, rocks, debris, and clutter. Check that handrails are not broken and are securely fastened. Both sides of steps should have handrails. Have leaves, snow, and ice cleared regularly. Use sand or salt on walkways during winter months. In the garage, clean up grease or oil spills. BATHROOM Install night lights. Install grab bars by the toilet and in the tub and shower. Use non-skid mats or decals in the tub or shower. Place a plastic non-slip stool in the shower to sit on, if needed. Keep floors dry and clean up all water on the floor immediately. Remove soap buildup in the tub or shower on a regular basis. Secure bath mats with non-slip, double-sided rug tape. Remove throw rugs and tripping hazards from the floors. BEDROOMS Install night lights. Make sure a bedside light is easy to reach. Do not use oversized bedding. Keep a telephone by your bedside. Have a firm chair with side arms to use for  getting dressed. Remove throw rugs and tripping hazards from the floor. KITCHEN Keep handles on pots and pans turned toward the center of the stove. Use back burners when possible. Clean up spills quickly and allow time for drying. Avoid walking on wet floors. Avoid hot utensils and knives. Position shelves so they are not too high or low. Place commonly used objects within easy reach. If necessary, use a sturdy step stool with a grab bar when reaching. Keep electrical cables out of the way. Do not use floor polish or wax that makes floors slippery. If you must use wax, use non-skid floor wax. Remove throw rugs and tripping hazards from the floor. STAIRWAYS Never leave objects on stairs. Place handrails on both sides of stairways and use them. Fix any loose handrails. Make sure handrails on both sides of the stairways are as long as the stairs. Check carpeting to make sure it is firmly attached along stairs. Make repairs to worn or loose carpet promptly. Avoid placing throw rugs at the top or bottom of stairways, or properly secure the rug with carpet tape to prevent slippage. Get rid of throw rugs, if possible. Have an electrician put in a light switch at the top and bottom of the stairs. OTHER FALL PREVENTION TIPS Wear low-heel or rubber-soled shoes that are supportive and fit well. Wear closed toe shoes. When using a stepladder, make sure it is fully opened and both spreaders are firmly  locked. Do not climb a closed stepladder. Add color or contrast paint or tape to grab bars and handrails in your home. Place contrasting color strips on first and last steps. Learn and use mobility aids as needed. Install an electrical emergency response system. Turn on lights to avoid dark areas. Replace light bulbs that burn out immediately. Get light switches that glow. Arrange furniture to create clear pathways. Keep furniture in the same place. Firmly attach carpet with non-skid or double-sided  tape. Eliminate uneven floor surfaces. Select a carpet pattern that does not visually hide the edge of steps. Be aware of all pets. OTHER HOME SAFETY TIPS Set the water temperature for 120 F (48.8 C). Keep emergency numbers on or near the telephone. Keep smoke detectors on every level of the home and near sleeping areas. Document Released: 03/02/2002 Document Revised: 09/11/2011 Document Reviewed: 06/01/2011 [facility] Patient Information 2014 [place]. Exercise, food supplements calcium and vitamin D3 that you are doing. Other blood work may be necessary if your bone density remains osteoporotic inspite of the medications. And exercise.     Follow-Up Instructions: Return in about 3 months (around 02/10/2021).   Orders:  Orders Placed This Encounter  Procedures   XR Lumbar Spine 2-3 Views   No orders of the defined types were placed in this encounter.     Procedures: No procedures performed   Clinical Data: No additional findings.   Subjective: Chief Complaint  Patient presents with   Lower Back - Fracture, Follow-up    HPI  Review of Systems   Objective: Vital Signs: BP (!) 186/101   Pulse (!) 116   Ht '5\' 6"'$  (1.676 m)   Wt 83 lb (37.6 kg)   BMI 13.40 kg/m   Physical Exam  Ortho Exam  Specialty Comments:  No specialty comments available.  Imaging: No results found.   PMFS History: Patient Active Problem List   Diagnosis Date Noted   Acute midline low back pain without sciatica 08/19/2020   COPD with emphysema ([place]) 02/25/2019   Chronic pancreatitis ([place]) 01/09/2017   Abnormal CT of the chest 10/04/2016   Ovarian cyst 10/04/2016   Tortuous aorta ([place]) 08/30/2016   Abdominal pain 08/09/2016   Diabetes mellitus without complication ([place]) [id number]   Protein-calorie malnutrition ([place]) 12/19/2015   Sensorineural hearing loss of both ears 12/19/2015   Weight loss 12/19/2015   Tobacco use disorder 12/19/2015   History of hyperthyroidism  12/19/2015   Essential hypertension 12/18/2015   PAD (peripheral artery disease) ([place]) 12/18/2015   Osteoporosis 12/18/2015   History of left breast cancer 12/18/2015   Neoplasm of left breast, primary tumor staging category Tis: ductal carcinoma in situ (DCIS) 03/25/2013   Past Medical History:  Diagnosis Date   Arthritis    Breast cancer ([place])    left   Diabetes mellitus without complication (HCC)    GERD (gastroesophageal reflux disease)    occ   History of radiation therapy 04/15/13-05/12/13   42.5 gray to left breast. lumpectomy cavity to 50 gray   Hyperlipidemia    Hypertension    Hyperthyroidism    Left fibular fracture 10/29/2016   Osteoporosis    Personal history of radiation therapy 2015    Family History  Problem Relation Age of Onset   Cancer Mother        pt canot remember   Diabetes Mother    Stroke Mother    Heart failure Father    Lung cancer Brother     Past  Surgical History:  Procedure Laterality Date   ABDOMINAL HYSTERECTOMY     BREAST LUMPECTOMY Left 03/04/2013   BREAST LUMPECTOMY WITH NEEDLE LOCALIZATION Left 03/04/2013   Procedure: LEFT BREAST LUMPECTOMY WITH NEEDLE LOCALIZATION;  Surgeon: Imogene Burn. Georgette Dover, MD;  Location: [facility];  Service: General;  Laterality: Left;   BREAST SURGERY     EYE SURGERY Bilateral 16   SPINE SURGERY  89   Social History   Occupational History   Occupation: retired  Tobacco Use   Smoking status: Every Day    Packs/day: 0.50    Years: 40.00    Pack years: 20.00    Types: Cigarettes   Smokeless tobacco: Never  Vaping Use   Vaping Use: Never used  Substance and Sexual Activity   Alcohol use: No   Drug use: No   Sexual activity: Never

## 2020-11-10 NOTE — Patient Instructions (Signed)
Plan: Fall Prevention and Home Safety Falls cause injuries and can affect all age groups. It is possible to use preventive measures to significantly decrease the likelihood of falls. There are many simple measures which can make your home safer and prevent falls. OUTDOORS Repair cracks and edges of walkways and driveways. Remove high doorway thresholds. Trim shrubbery on the main path into your home. Have good outside lighting. Clear walkways of tools, rocks, debris, and clutter. Check that handrails are not broken and are securely fastened. Both sides of steps should have handrails. Have leaves, snow, and ice cleared regularly. Use sand or salt on walkways during winter months. In the garage, clean up grease or oil spills. BATHROOM Install night lights. Install grab bars by the toilet and in the tub and shower. Use non-skid mats or decals in the tub or shower. Place a plastic non-slip stool in the shower to sit on, if needed. Keep floors dry and clean up all water on the floor immediately. Remove soap buildup in the tub or shower on a regular basis. Secure bath mats with non-slip, double-sided rug tape. Remove throw rugs and tripping hazards from the floors. BEDROOMS Install night lights. Make sure a bedside light is easy to reach. Do not use oversized bedding. Keep a telephone by your bedside. Have a firm chair with side arms to use for getting dressed. Remove throw rugs and tripping hazards from the floor. KITCHEN Keep handles on pots and pans turned toward the center of the stove. Use back burners when possible. Clean up spills quickly and allow time for drying. Avoid walking on wet floors. Avoid hot utensils and knives. Position shelves so they are not too high or low. Place commonly used objects within easy reach. If necessary, use a sturdy step stool with a grab bar when reaching. Keep electrical cables out of the way. Do not use floor polish or wax that makes floors  slippery. If you must use wax, use non-skid floor wax. Remove throw rugs and tripping hazards from the floor. STAIRWAYS Never leave objects on stairs. Place handrails on both sides of stairways and use them. Fix any loose handrails. Make sure handrails on both sides of the stairways are as long as the stairs. Check carpeting to make sure it is firmly attached along stairs. Make repairs to worn or loose carpet promptly. Avoid placing throw rugs at the top or bottom of stairways, or properly secure the rug with carpet tape to prevent slippage. Get rid of throw rugs, if possible. Have an electrician put in a light switch at the top and bottom of the stairs. OTHER FALL PREVENTION TIPS Wear low-heel or rubber-soled shoes that are supportive and fit well. Wear closed toe shoes. When using a stepladder, make sure it is fully opened and both spreaders are firmly locked. Do not climb a closed stepladder. Add color or contrast paint or tape to grab bars and handrails in your home. Place contrasting color strips on first and last steps. Learn and use mobility aids as needed. Install an electrical emergency response system. Turn on lights to avoid dark areas. Replace light bulbs that burn out immediately. Get light switches that glow. Arrange furniture to create clear pathways. Keep furniture in the same place. Firmly attach carpet with non-skid or double-sided tape. Eliminate uneven floor surfaces. Select a carpet pattern that does not visually hide the edge of steps. Be aware of all pets. OTHER HOME SAFETY TIPS Set the water temperature for 120 F (48.8 C).  Keep emergency numbers on or near the telephone. Keep smoke detectors on every level of the home and near sleeping areas. Document Released: 03/02/2002 Document Revised: 09/11/2011 Document Reviewed: 06/01/2011 [facility] Patient Information 2014 [place]. Exercise, food supplements calcium and vitamin D3 that you are doing. Other blood work  may be necessary if your bone density remains osteoporotic inspite of the medications. And exercise.

## 2020-12-15 ENCOUNTER — Other Ambulatory Visit: Payer: Self-pay | Admitting: Surgery

## 2020-12-15 DIAGNOSIS — Z853 Personal history of malignant neoplasm of breast: Secondary | ICD-10-CM

## 2021-03-01 ENCOUNTER — Telehealth: Payer: Self-pay | Admitting: *Deleted

## 2021-03-01 NOTE — Chronic Care Management (AMB) (Signed)
  Chronic Care Management   Note  03/01/2021 Name: GUDELIA EUGENE MRN: [id number] DOB: November 20, 1934  STEVIE ERTLE is a 86 y.o. year old female who is a primary care patient of Burns, Claudina Lick, MD. I reached out to [name] by phone today in response to a referral sent by Ms. Mendenhall PCP.  Ms. Bawa was given information about Chronic Care Management services today including:  CCM service includes personalized support from designated clinical staff supervised by her physician, including individualized plan of care and coordination with other care providers 24/7 contact phone numbers for assistance for urgent and routine care needs. Service will only be billed when office clinical staff spend 20 minutes or more in a month to coordinate care. Only one practitioner may furnish and bill the service in a calendar month. The patient may stop CCM services at any time (effective at the end of the month) by phone call to the office staff. The patient is responsible for co-pay (up to 20% after annual deductible is met) if co-pay is required by the individual health plan.   Patient agreed to services and verbal consent obtained.   Follow up plan: Telephone appointment with care management team member scheduled for:03/03/21  [facility]  Direct Dial: [phone]

## 2021-03-03 ENCOUNTER — Telehealth: Payer: Self-pay | Admitting: *Deleted

## 2021-03-03 ENCOUNTER — Encounter: Payer: Self-pay | Admitting: *Deleted

## 2021-03-03 ENCOUNTER — Telehealth: Payer: Medicare Other

## 2021-03-03 NOTE — Telephone Encounter (Signed)
  Chronic Care Management   Follow Up Note   03/03/2021 Name: Rhonda Reynolds MRN: [id number] DOB: 1935/03/11  Referred by: Binnie Rail, MD Reason for referral : Chronic Care Management (CCM RN CM Initial Outreach- Unsuccessful attempt)  An unsuccessful telephone outreach was attempted today. The patient was referred to the case management team for assistance with care management and care coordination.   Follow Up Plan:  A HIPPA compliant phone message was left for the patient providing contact information and requesting a return call Will place request with scheduling care guide to contact patient to re-schedule today's missed CCM RN initial telephone appointment if I do not hear back from patient by end of day  Oneta Rack, RN, BSN, [place] [phone]: direct office

## 2021-03-08 ENCOUNTER — Telehealth: Payer: Self-pay | Admitting: *Deleted

## 2021-03-08 ENCOUNTER — Telehealth: Payer: Medicare Other

## 2021-03-08 ENCOUNTER — Encounter: Payer: Self-pay | Admitting: *Deleted

## 2021-03-08 NOTE — Telephone Encounter (Addendum)
°  Chronic Care Management   Follow Up Note   03/08/2021 Name: Rhonda Reynolds MRN: [id number] DOB: [birth year]  Referred by: Binnie Rail, MD Reason for referral : Chronic Care Management (CCM RN CM Initial Outreach- second consecutive unsuccessful attempt)  A second unsuccessful telephone outreach was attempted today. The patient was referred to the case management team for assistance with care management and care coordination.   Follow Up Plan:  A HIPPA compliant phone message was left for the patient providing contact information and requesting a return call Will place request with scheduling care guide to contact patient to re-schedule today's missed CCM RN initial telephone appointment if I do not hear back from patient by end of day  Addendum: patient returned call, left voice mail; returned patient's call- call re-scheduled with patient around her upcoming travel plans: Tuesday March 28, 2021 at 1:15 pm  Oneta Rack, RN, BSN, [place] [phone]: direct office

## 2021-03-28 ENCOUNTER — Ambulatory Visit (INDEPENDENT_AMBULATORY_CARE_PROVIDER_SITE_OTHER): Payer: Medicare Other | Admitting: *Deleted

## 2021-03-28 DIAGNOSIS — I10 Essential (primary) hypertension: Secondary | ICD-10-CM

## 2021-03-28 DIAGNOSIS — J439 Emphysema, unspecified: Secondary | ICD-10-CM

## 2021-03-28 NOTE — Chronic Care Management (AMB) (Signed)
Chronic Care Management   CCM RN Visit Note  03/28/2021 Name: Rhonda Reynolds MRN: [id number] DOB: Nov 27, 1934  Subjective: Rhonda Reynolds is a 87 y.o. year old female who is a primary care patient of Burns, Claudina Lick, MD. The care management team was consulted for assistance with disease management and care coordination needs.    Engaged with patient by telephone for initial visit in response to provider referral for case management and/or care coordination services.   Consent to Services:  The patient was given information about Chronic Care Management services, agreed to services, and gave verbal consent 03/01/21 prior to initiation of services.  Please see initial visit note for detailed documentation.  Patient agreed to services and verbal consent obtained.   Assessment: Review of patient past medical history, allergies, medications, health status, including review of consultants reports, laboratory and other test data, was performed as part of comprehensive evaluation and provision of chronic care management services.   SDOH (Social Determinants of Health) assessments and interventions performed:  SDOH Interventions    Flowsheet Row Most Recent Value  SDOH Interventions   Food Insecurity Interventions Intervention Not Indicated  [Reports gets food stamps,  not interested in Meals on [place] Interventions Intervention Not Indicated  [Lives in senior apartment complex,  one level,  lives alone]  Transportation Interventions Intervention Not Indicated  [Patient reports drives self]      CCM Care Plan Allergies  Allergen Reactions   Forteo [Parathyroid Hormone (Recomb)]     Stomach problems   Other Other (See Comments)    Stomach problems   Pseudoephedrine Hcl Other (See Comments)    Other   Fosamax [Alendronate Sodium]     GERD   Outpatient Encounter Medications as of 03/28/2021  Medication Sig Note   aspirin EC 81 MG tablet Take 81 mg by mouth daily.     cholecalciferol (VITAMIN D) 1000 units tablet Take 3 tablets (3,000 Units total) by mouth daily.    guaiFENesin (MUCINEX) 600 MG 12 hr tablet Take by mouth.    tiotropium (SPIRIVA HANDIHALER) 18 MCG inhalation capsule INHALE THE CONTENTS OF 1 CAPSULE VIA INHALATION DEVICE EVERY DAY    Vitamin D, Ergocalciferol, (DRISDOL) 1.25 MG (50000 UNIT) CAPS capsule Take 1 capsule (50,000 Units total) by mouth every 7 (seven) days.    amLODipine (NORVASC) 5 MG tablet TAKE 1 TABLET(5 MG) BY MOUTH DAILY (Patient not taking: Reported on 03/28/2021) 03/28/2021: Reports not taking- states medication has expired and she states she stopped taking-- encouraged patient to schedule PCP office visit for BP evaluation   carboxymethylcellulose (REFRESH PLUS) 0.5 % SOLN 1 drop 3 (three) times daily as needed (DRY EYE).  (Patient not taking: Reported on 03/28/2021)    cycloSPORINE (RESTASIS) 0.05 % ophthalmic emulsion Place 1 drop into both eyes 2 (two) times daily. (Patient not taking: Reported on 03/28/2021)    traMADol-acetaminophen (ULTRACET) 37.5-325 MG tablet Take 1 tablet by mouth every 6 (six) hours as needed. (Patient not taking: Reported on 03/28/2021)    No facility-administered encounter medications on file as of 03/28/2021.   Patient Active Problem List   Diagnosis Date Noted   Acute midline low back pain without sciatica 08/19/2020   COPD with emphysema ([place]) 02/25/2019   Chronic pancreatitis ([place]) 01/09/2017   Abnormal CT of the chest 10/04/2016   Ovarian cyst 10/04/2016   Tortuous aorta ([place]) 08/30/2016   Abdominal pain 08/09/2016   Diabetes mellitus without complication ([place]) 61/44/3154   Protein-calorie malnutrition ([place])  12/19/2015   Sensorineural hearing loss of both ears 12/19/2015   Weight loss 12/19/2015   Tobacco use disorder 12/19/2015   History of hyperthyroidism 12/19/2015   Essential hypertension 12/18/2015   PAD (peripheral artery disease) ([place]) 12/18/2015   Osteoporosis 12/18/2015   History of  left breast cancer 12/18/2015   Neoplasm of left breast, primary tumor staging category Tis: ductal carcinoma in situ (DCIS) 03/25/2013   Conditions to be addressed/monitored:  HTN and COPD  Care Plan : RN Care Manager Plan of Care  Updates made by Knox Royalty, RN since 03/28/2021 12:00 AM     Problem: Chronic Disease Management Needs   Priority: High     Long-Range Goal: Development of plan of care for long term chronic disease management   Start Date: 03/28/2021  Expected End Date: 03/28/2022  Priority: High  Note:   Current Barriers:  Chronic Disease Management support and education needs related to HTN and COPD  RNCM Clinical Goal(s):  Patient will demonstrate ongoing health management independence as evidenced by adherence to plan of care for COPD; HTN        through collaboration with RN Care manager, provider, and care team.   Interventions: 1:1 collaboration with primary care provider regarding development and update of comprehensive plan of care as evidenced by provider attestation and co-signature Inter-disciplinary care team collaboration (see longitudinal plan of care) Evaluation of current treatment plan related to  self management and patient's adherence to plan as established by provider Pain assessment completed: patient denies acute/ chronic pain Falls assessment completed: patient denies recent falls; uses walker regularly; fall prevention education provided/ discussed with patient  COPD: (Status: New goal.) Long Term Goal  Reviewed medications with patient, including use of prescribed maintenance and rescue inhalers, and provided instruction on medication management and the importance of adherence- reports uses maintenance inhaler as prescribed; patient tells me today that her prescribed medication for HTN "expired;" states she does not take expired medication, and tells me she is not currently taking amlodipine; I am unclear on how long she has not been taking;  confirmed patient has not contacted her outpatient pharmacy to obtain re-fill; discussed need to schedule appointment with PCP to re-evaluate/ assess her need for antihypertensives; she reports she will schedule PCP appointment when she returns to [place] (currently visiting family in [place]) Provided patient with basic written and verbal COPD education on self care/management/and exacerbation prevention Provided instruction about proper use of medications used for management of COPD including inhalers Advised patient to self assesses COPD action plan zone and make appointment with provider if in the yellow zone for 48 hours without improvement Discussed the importance of adequate rest and management of fatigue with COPD Screening for signs and symptoms of depression related to chronic disease state  Assessed social determinant of health barriers Confirmed patient continues smoking 5 cigarettes per day: confirmed patient remains not interested in smoking cessation; denies concerns around breathing status: reports "I just take my time with my activities and I don't have any issues with my breathing" Confirmed patient has not yet obtained flub vaccine for 2022-23 flu/ winter season: encouraged patient to obtain flu vaccine promptly  Hypertension: (Status: New goal.) Long Term Goal  Last practice recorded BP readings:  BP Readings from Last 3 Encounters:  11/10/20 (!) 186/101  10/07/20 (!) 168/93  08/25/20 129/87  Most recent eGFR/CrCl: No results found for: EGFR  No components found for: CRCL  Evaluation of current treatment plan related to hypertension self  management and patient's adherence to plan as established by provider;   Provided education to patient re: stroke prevention, s/s of heart attack and stroke; Reviewed prescribed diet heart healthy, low salt Reviewed medications with patient and discussed importance of compliance;  Discussed plans with patient for ongoing care management follow up and  provided patient with direct contact information for care management team; Advised patient to discuss whether she should/ should not be taking medications for high blood pressure with provider; Discussed complications of poorly controlled blood pressure such as heart disease, stroke, circulatory complications, vision complications, kidney impairment, sexual dysfunction;  Medication issue with prescribed anti-hypertensive medications: she has not been taking amlodipine, as noted above, medication list in EHR updated accordingly  Patient Goals/Self-Care Activities: As evidenced by review of EHR, collaboration with care team, and patient reporting during CCM RN CM outreach, Patient Lysa will: Take  medications as prescribed Attend all scheduled provider appointments Call pharmacy for medication refills Call provider office for new concerns or questions Try to follow heart healthy, low salt, low cholesterol diet Please make an appointment with Dr. Quay Burow to talk to her about whether you should be on medication for your blood pressure; I have let Dr. Quay Burow know that you have not been taking the amlodipine that was previously prescribed for you.  The number to call to schedule an appointment with Dr. Quay Burow is:  [phone] Please obtain a flu vaccine shot for this winter season as soon as possible Please make an appointment for your annual eye exam Continue to take effort to prevent falls; continue to use your walker when you go outside of your home    Plan: Telephone follow up appointment with care management team member scheduled for:  Monday, June 19, 2021 at 2:15 pm The patient has been provided with contact information for the care management team and has been advised to call with any health related questions or concerns   Oneta Rack, RN, BSN, [place] [phone]: direct office

## 2021-03-28 NOTE — Patient Instructions (Addendum)
Visit Information  ° °[name], thank you for taking time to talk with me today. Please don't hesitate to contact me if I can be of assistance to you before our next scheduled telephone appointment. ° °Below are the goals we discussed today:  °Patient Self-Care Activities: °Patient Rhonda Reynolds will: °Take  medications as prescribed °Attend all scheduled provider appointments °Call pharmacy for medication refills °Call provider office for new concerns or questions °Try to follow heart healthy, low salt, low cholesterol diet °Please make an appointment with Dr. Burns to talk to her about whether you should be on medication for your blood pressure; I have let Dr. Burns know that you have not been taking the amlodipine that was previously prescribed for you.  The number to call to schedule an appointment with Dr. Burns is:  [phone] °Please obtain a flu vaccine shot for this winter season as soon as possible °Please make an appointment for your annual eye exam °Continue to take effort to prevent falls; continue to use your walker when you go outside of your home ° °Our next scheduled telephone follow up visit/ appointment with care management team member is scheduled on:  Monday, June 19, 2021 at 2:15 pm ° °If you need to cancel or re-schedule our visit, please call [phone] and our care guide team will be happy to assist you. °  °I look forward to hearing about your progress. °  °Rhonda Mckinney Tousey, RN, BSN, CCRN Alumnus °[facility] RN Care Coordination- [facility] °[phone]: direct office ° °If you are experiencing a Mental Health or Behavioral Health Crisis or need someone to talk to, please  °call the Suicide and Crisis Lifeline: 988 °call the [place] National Suicide Prevention Lifeline: 1-800-273-8255 or TTY: 1-800-799-4 TTY (1-800-799-4889) to talk to a trained counselor °call 1-800-273-TALK (toll free, 24 hour hotline) °go to [facility] [address],  [place] ([phone]) °call 911  ° °Hypertension, Adult °Hypertension is another name for high blood pressure. High blood pressure forces your heart to work harder to pump blood. This can cause problems over time. °There are two numbers in a blood pressure reading. There is a top number (systolic) over a bottom number (diastolic). It is best to have a blood pressure that is below 120/80. Healthy choices can help lower your blood pressure, or you may need medicine to help lower it. °What are the causes? °The cause of this condition is not known. Some conditions may be related to high blood pressure. °What increases the risk? °Smoking. °Having type 2 diabetes mellitus, high cholesterol, or both. °Not getting enough exercise or physical activity. °Being overweight. °Having too much fat, sugar, calories, or salt (sodium) in your diet. °Drinking too much alcohol. °Having long-term (chronic) kidney disease. °Having a family history of high blood pressure. °Age. Risk increases with age. °Race. You may be at higher risk if you are African American. °Gender. Men are at higher risk than women before age 45. After age 65, women are at higher risk than men. °Having obstructive sleep apnea. °Stress. °What are the signs or symptoms? °High blood pressure may not cause symptoms. Very high blood pressure (hypertensive crisis) may cause: °Headache. °Feelings of worry or nervousness (anxiety). °Shortness of breath. °Nosebleed. °A feeling of being sick to your stomach (nausea). °Throwing up (vomiting). °Changes in how you see. °Very bad chest pain. °Seizures. °How is this treated? °This condition is treated by making healthy lifestyle changes, such as: °Eating healthy foods. °Exercising more. °  Drinking less alcohol. °Your health care provider may prescribe medicine if lifestyle changes are not enough to get your blood pressure under control, and if: °Your top number is above 130. °Your bottom number is above 80. °Your personal  target blood pressure may vary. °Follow these instructions at home: °Eating and drinking ° °If told, follow the DASH eating plan. To follow this plan: °Fill one half of your plate at each meal with fruits and vegetables. °Fill one fourth of your plate at each meal with whole grains. Whole grains include whole-wheat pasta, brown rice, and whole-grain bread. °Eat or drink low-fat dairy products, such as skim milk or low-fat yogurt. °Fill one fourth of your plate at each meal with low-fat (lean) proteins. Low-fat proteins include fish, chicken without skin, eggs, beans, and tofu. °Avoid fatty meat, cured and processed meat, or chicken with skin. °Avoid pre-made or processed food. °Eat less than 1,500 mg of salt each day. °Do not drink alcohol if: °Your doctor tells you not to drink. °You are pregnant, may be pregnant, or are planning to become pregnant. °If you drink alcohol: °Limit how much you use to: °0-1 drink a day for women. °0-2 drinks a day for men. °Be aware of how much alcohol is in your drink. In the [place] one drink equals one 12 oz bottle of beer (355 mL), one 5 oz glass of wine (148 mL), or one 1½ oz glass of hard liquor (44 mL). °Lifestyle ° °Work with your doctor to stay at a healthy weight or to lose weight. Ask your doctor what the best weight is for you. °Get at least 30 minutes of exercise most days of the week. This may include walking, swimming, or biking. °Get at least 30 minutes of exercise that strengthens your muscles (resistance exercise) at least 3 days a week. This may include lifting weights or doing Pilates. °Do not use any products that contain nicotine or tobacco, such as cigarettes, e-cigarettes, and chewing tobacco. If you need help quitting, ask your doctor. °Check your blood pressure at home as told by your doctor. °Keep all follow-up visits as told by your doctor. This is important. °Medicines °Take over-the-counter and prescription medicines only as told by your doctor. Follow  directions carefully. °Do not skip doses of blood pressure medicine. The medicine does not work as well if you skip doses. Skipping doses also puts you at risk for problems. °Ask your doctor about side effects or reactions to medicines that you should watch for. °Contact a doctor if you: °Think you are having a reaction to the medicine you are taking. °Have headaches that keep coming back (recurring). °Feel dizzy. °Have swelling in your ankles. °Have trouble with your vision. °Get help right away if you: °Get a very bad headache. °Start to feel mixed up (confused). °Feel weak or numb. °Feel faint. °Have very bad pain in your: °Chest. °Belly (abdomen). °Throw up more than once. °Have trouble breathing. °Summary °Hypertension is another name for high blood pressure. °High blood pressure forces your heart to work harder to pump blood. °For most people, a normal blood pressure is less than 120/80. °Making healthy choices can help lower blood pressure. If your blood pressure does not get lower with healthy choices, you may need to take medicine. °This information is not intended to replace advice given to you by your health care provider. Make sure you discuss any questions you have with your health care provider. °Document Revised: 11/20/2017 Document Reviewed: 11/20/2017 °Elsevier Patient Education ©   2022 [employer] Inc. ° °Following is a copy of your full care plan:  °Care Plan : RN Care Manager Plan of Care  °Updates made by Reynolds, Rhonda M, RN since 03/28/2021 12:00 AM  °  ° °Problem: Chronic Disease Management Needs   °Priority: High  °  ° °Long-Range Goal: Development of plan of care for long term chronic disease management   °Start Date: 03/28/2021  °Expected End Date: 03/28/2022  °Priority: High  °Note:   °Current Barriers:  °Chronic Disease Management support and education needs related to HTN and COPD ° °RNCM Clinical Goal(s):  °Patient will demonstrate ongoing health management independence as evidenced by adherence  to plan of care for COPD; HTN        through collaboration with RN Care manager, provider, and care team.  ° °Interventions: °1:1 collaboration with primary care provider regarding development and update of comprehensive plan of care as evidenced by provider attestation and co-signature °Inter-disciplinary care team collaboration (see longitudinal plan of care) °Evaluation of current treatment plan related to  self management and patient's adherence to plan as established by provider °Pain assessment completed: patient denies acute/ chronic pain °Falls assessment completed: patient denies recent falls; uses walker regularly; fall prevention education provided/ discussed with patient ° °COPD: (Status: New goal.) Long Term Goal  °Reviewed medications with patient, including use of prescribed maintenance and rescue inhalers, and provided instruction on medication management and the importance of adherence- reports uses maintenance inhaler as prescribed; patient tells me today that her prescribed medication for HTN "expired;" states she does not take expired medication, and tells me she is not currently taking amlodipine; I am unclear on how long she has not been taking; confirmed patient has not contacted her outpatient pharmacy to obtain re-fill; discussed need to schedule appointment with PCP to re-evaluate/ assess her need for antihypertensives; she reports she will schedule PCP appointment when she returns to [place] (currently visiting family in [place]) °Provided patient with basic written and verbal COPD education on self care/management/and exacerbation prevention °Provided instruction about proper use of medications used for management of COPD including inhalers °Advised patient to self assesses COPD action plan zone and make appointment with provider if in the yellow zone for 48 hours without improvement °Discussed the importance of adequate rest and management of fatigue with COPD °Screening for signs and symptoms of  depression related to chronic disease state  °Assessed social determinant of health barriers °Confirmed patient continues smoking 5 cigarettes per day: confirmed patient remains not interested in smoking cessation; denies concerns around breathing status: reports "I just take my time with my activities and I don't have any issues with my breathing" °Confirmed patient has not yet obtained flub vaccine for 2022-23 flu/ winter season: encouraged patient to obtain flu vaccine promptly ° °Hypertension: (Status: New goal.) Long Term Goal  °Last practice recorded BP readings:  °BP Readings from Last 3 Encounters:  °11/10/20 (!) 186/101  °10/07/20 (!) 168/93  °08/25/20 129/87  °Most recent eGFR/CrCl: No results found for: EGFR  No components found for: CRCL ° °Evaluation of current treatment plan related to hypertension self management and patient's adherence to plan as established by provider;   °Provided education to patient re: stroke prevention, s/s of heart attack and stroke; °Reviewed prescribed diet heart healthy, low salt °Reviewed medications with patient and discussed importance of compliance;  °Discussed plans with patient for ongoing care management follow up and provided patient with direct contact information for care management team; °Advised patient to   discuss whether she should/ should not be taking medications for high blood pressure with provider; Discussed complications of poorly controlled blood pressure such as heart disease, stroke, circulatory complications, vision complications, kidney impairment, sexual dysfunction;  Medication issue with prescribed anti-hypertensive medications: she has not been taking amlodipine, as noted above, medication list in EHR updated accordingly  Patient Goals/Self-Care Activities: As evidenced by review of EHR, collaboration with care team, and patient reporting during CCM RN CM outreach, Patient Dedra will: Take  medications as prescribed Attend all scheduled  provider appointments Call pharmacy for medication refills Call provider office for new concerns or questions Try to follow heart healthy, low salt, low cholesterol diet Please make an appointment with Dr. Quay Burow to talk to her about whether you should be on medication for your blood pressure; I have let Dr. Quay Burow know that you have not been taking the amlodipine that was previously prescribed for you.  The number to call to schedule an appointment with Dr. Quay Burow is:  [phone] Please obtain a flu vaccine shot for this winter season as soon as possible Please make an appointment for your annual eye exam Continue to take effort to prevent falls; continue to use your walker when you go outside of your home    Consent to CCM Services: Ms. Demond was given information about Chronic Care Management services 03/01/21 including:  CCM service includes personalized support from designated clinical staff supervised by her physician, including individualized plan of care and coordination with other care providers 24/7 contact phone numbers for assistance for urgent and routine care needs. Service will only be billed when office clinical staff spend 20 minutes or more in a month to coordinate care. Only one practitioner may furnish and bill the service in a calendar month. The patient may stop CCM services at any time (effective at the end of the month) by phone call to the office staff. The patient will be responsible for cost sharing (co-pay) of up to 20% of the service fee (after annual deductible is met).  Patient agreed to services and verbal consent obtained.   The patient verbalized understanding of instructions, educational materials, and care plan provided today and agreed to receive a mailed copy of patient instructions, educational materials, and care plan  Telephone follow up appointment with care management team member scheduled for:  Monday, June 19, 2021 at 2:15 pm The patient has been  provided with contact information for the care management team and has been advised to call with any health related questions or concerns

## 2021-04-24 ENCOUNTER — Telehealth: Payer: Self-pay

## 2021-04-24 ENCOUNTER — Ambulatory Visit: Payer: Medicare Other | Admitting: *Deleted

## 2021-04-24 DIAGNOSIS — J439 Emphysema, unspecified: Secondary | ICD-10-CM

## 2021-04-24 DIAGNOSIS — I10 Essential (primary) hypertension: Secondary | ICD-10-CM

## 2021-04-24 NOTE — Chronic Care Management (AMB) (Signed)
Chronic Care Management   CCM RN Visit Note  04/24/2021 Name: Rhonda Reynolds MRN: [id number] DOB: Aug 16, 1934  Subjective: Rhonda Reynolds is a 87 y.o. year old female who is a primary care Rhonda of Burns, Claudina Lick, MD. The care management team was consulted for assistance with disease management and care coordination needs.    Engaged with Rhonda by telephone for  acute/ unscheduled outreach  in response to provider referral for case management and/or care coordination services.   Consent to Services:  The Rhonda was given information about Chronic Care Management services, agreed to services, and gave verbal consent prior to initiation of services.  Please see initial visit note for detailed documentation.  Rhonda agreed to services and verbal consent obtained.   Assessment: Review of Rhonda past medical history, allergies, medications, health status, including review of consultants reports, laboratory and other test data, was performed as part of comprehensive evaluation and provision of chronic care management services.  CCM Care Plan  Allergies  Allergen Reactions   Forteo [Parathyroid Hormone (Recomb)]     Stomach problems   Other Other (See Comments)    Stomach problems   Pseudoephedrine Hcl Other (See Comments)    Other   Fosamax [Alendronate Sodium]     GERD   Outpatient Encounter Medications as of 04/24/2021  Medication Sig Note   amLODipine (NORVASC) 5 MG tablet TAKE 1 TABLET(5 MG) BY MOUTH DAILY (Rhonda not taking: Reported on 03/28/2021) 03/28/2021: Reports not taking- states medication has expired and she states she stopped taking-- encouraged Rhonda to schedule PCP office visit for BP evaluation   aspirin EC 81 MG tablet Take 81 mg by mouth daily.    carboxymethylcellulose (REFRESH PLUS) 0.5 % SOLN 1 drop 3 (three) times daily as needed (DRY EYE).  (Rhonda not taking: Reported on 03/28/2021)    cholecalciferol (VITAMIN D) 1000 units tablet Take 3 tablets (3,000  Units total) by mouth daily.    cycloSPORINE (RESTASIS) 0.05 % ophthalmic emulsion Place 1 drop into both eyes 2 (two) times daily. (Rhonda not taking: Reported on 03/28/2021)    guaiFENesin (MUCINEX) 600 MG 12 hr tablet Take by mouth.    tiotropium (SPIRIVA HANDIHALER) 18 MCG inhalation capsule INHALE THE CONTENTS OF 1 CAPSULE VIA INHALATION DEVICE EVERY DAY    traMADol-acetaminophen (ULTRACET) 37.5-325 MG tablet Take 1 tablet by mouth every 6 (six) hours as needed. (Rhonda not taking: Reported on 03/28/2021)    Vitamin D, Ergocalciferol, (DRISDOL) 1.25 MG (50000 UNIT) CAPS capsule Take 1 capsule (50,000 Units total) by mouth every 7 (seven) days.    No facility-administered encounter medications on file as of 04/24/2021.   Rhonda Active Problem List   Diagnosis Date Noted   Acute midline low back pain without sciatica 08/19/2020   COPD with emphysema ([place]) 02/25/2019   Chronic pancreatitis ([place]) 01/09/2017   Abnormal CT of the chest 10/04/2016   Ovarian cyst 10/04/2016   Tortuous aorta ([place]) 08/30/2016   Abdominal pain 08/09/2016   Diabetes mellitus without complication ([place]) 77/01/6578   Protein-calorie malnutrition ([place]) 12/19/2015   Sensorineural hearing loss of both ears 12/19/2015   Weight loss 12/19/2015   Tobacco use disorder 12/19/2015   History of hyperthyroidism 12/19/2015   Essential hypertension 12/18/2015   PAD (peripheral artery disease) ([place]) 12/18/2015   Osteoporosis 12/18/2015   History of left breast cancer 12/18/2015   Neoplasm of left breast, primary tumor staging category Tis: ductal carcinoma in situ (DCIS) 03/25/2013   Conditions to be  addressed/monitored:  HTN and COPD/ emphysema  Care Plan : RN Care Manager Plan of Care  Updates made by Knox Royalty, RN since 04/24/2021 12:00 AM     Problem: Chronic Disease Management Needs   Priority: High     Long-Range Goal: Ongoing adherence to established plan of care for long term chronic disease management    Start Date: 03/28/2021  Expected End Date: 03/28/2022  Priority: High  Note:   Current Barriers:  Chronic Disease Management support and education needs related to HTN and COPD  RNCM Clinical Goal(s):  Rhonda will demonstrate ongoing health management independence as evidenced by adherence to plan of care for COPD; HTN        through collaboration with RN Care manager, provider, and care team.   Interventions: 1:1 collaboration with primary care provider regarding development and update of comprehensive plan of care as evidenced by provider attestation and co-signature Inter-disciplinary care team collaboration (see longitudinal plan of care) Evaluation of current treatment plan related to  self management and Rhonda's adherence to plan as established by provider 04/24/21: received call/ voice mail from Rhonda requesting call back; Rhonda reports she has scheduled AWE 04/27/21 at PCP office: states that she only drives short distances and does not have transportation arranged for upcoming scheduled visit Discussed transportation benefits through [facility] insurance plan: she is not aware of this benefit and states she will reach out to insurance provider Reports she has previously used [employer] transportation, which she was very pleased with-- she requests a call from [facility] health transportation, as this is her preference for transportation-- will place urgent [place] referral to follow up/ hopefully arrange transportation for 04/27/21 to PCP office for scheduled AWE Encouraged Rhonda to consider scheduling PCP office visit for annual exam: last PCP office visit 08/19/20 Confirmed Rhonda has NOT yet obtained flu vaccine as we previously discussed- encouraged her to consider obtaining soon  From last outreach 03/28/21:  Pain assessment completed: Rhonda denies acute/ chronic pain Falls assessment completed: Rhonda denies recent falls; uses walker regularly; fall prevention  education provided/ discussed with Rhonda  COPD: (Status: New goal.) Long Term Goal  Reviewed medications with Rhonda, including use of prescribed maintenance and rescue inhalers, and provided instruction on medication management and the importance of adherence- reports uses maintenance inhaler as prescribed; Rhonda tells me today that her prescribed medication for HTN "expired;" states she does not take expired medication, and tells me she is not currently taking amlodipine; I am unclear on how long she has not been taking; confirmed Rhonda has not contacted her outpatient pharmacy to obtain re-fill; discussed need to schedule appointment with PCP to re-evaluate/ assess her need for antihypertensives; she reports she will schedule PCP appointment when she returns to [place] (currently visiting family in [place]) Provided Rhonda with basic written and verbal COPD education on self care/management/and exacerbation prevention Provided instruction about proper use of medications used for management of COPD including inhalers Advised Rhonda to self assesses COPD action plan zone and make appointment with provider if in the yellow zone for 48 hours without improvement Discussed the importance of adequate rest and management of fatigue with COPD Screening for signs and symptoms of depression related to chronic disease state  Assessed social determinant of health barriers Confirmed Rhonda continues smoking 5 cigarettes per day: confirmed Rhonda remains not interested in smoking cessation; denies concerns around breathing status: reports "I just take my time with my activities and I don't have any issues  with my breathing" Confirmed Rhonda has not yet obtained flub vaccine for 2022-23 flu/ winter season: encouraged Rhonda to obtain flu vaccine promptly  Hypertension: (Status: New goal.) Long Term Goal  Last practice recorded BP readings:  BP Readings from Last 3 Encounters:  11/10/20 (!) 186/101  10/07/20  (!) 168/93  08/25/20 129/87  Most recent eGFR/CrCl: No results found for: EGFR  No components found for: CRCL  Evaluation of current treatment plan related to hypertension self management and Rhonda's adherence to plan as established by provider;   Provided education to Rhonda re: stroke prevention, s/s of heart attack and stroke; Reviewed prescribed diet heart healthy, low salt Reviewed medications with Rhonda and discussed importance of compliance;  Discussed plans with Rhonda for ongoing care management follow up and provided Rhonda with direct contact information for care management team; Advised Rhonda to discuss whether she should/ should not be taking medications for high blood pressure with provider; Discussed complications of poorly controlled blood pressure such as heart disease, stroke, circulatory complications, vision complications, kidney impairment, sexual dysfunction;  Medication issue with prescribed anti-hypertensive medications: she has not been taking amlodipine, as noted above, medication list in EHR updated accordingly  Rhonda Goals/Self-Care Activities: As evidenced by review of EHR, collaboration with care team, and Rhonda reporting during CCM RN CM outreach, Rhonda Reynolds will: Take  medications as prescribed Attend all scheduled provider appointments Call pharmacy for medication refills Call provider office for new concerns or questions Try to follow heart healthy, low salt, low cholesterol diet Please make an appointment with Dr. Quay Burow to talk to her about whether you should be on medication for your blood pressure; I have let Dr. Quay Burow know that you have not been taking the amlodipine that was previously prescribed for you.  The number to call to schedule an appointment with Dr. Quay Burow is:  [phone] Please obtain a flu vaccine shot for this winter season as soon as possible Please make an appointment for your annual eye exam Continue to take effort to  prevent falls; continue to use your walker when you go outside of your home    Plan: Telephone follow up appointment with care management team member scheduled for:  Monday, June 19, 2021 at 2:15 pm The Rhonda has been provided with contact information for the care management team and has been advised to call with any health related questions or concerns  Oneta Rack, RN, BSN, [place] [phone]: direct office

## 2021-04-24 NOTE — Telephone Encounter (Signed)
° °  Telephone encounter was:  Successful.  04/24/2021 Name: [name] MRN: [id number] DOB: May 30, 1934  Rhonda Reynolds is a 87 y.o. year old female who is a primary care patient of Burns, Claudina Lick, MD . The community resource team was consulted for assistance with Transportation Needs   Care guide performed the following interventions: Patient provided with information about care guide support team and interviewed to confirm resource needs.Called Patient and set up transportation for her. She does not have the correct card on file but she is set to be picked up at 12:30  on 04/27/21 for her 1:00 appointment.  Follow Up Plan:  No further follow up planned at this time. The patient has been provided with needed resources.    [place], Care Management  [phone] [address], [place], [name] [address] Phone: [phone] Email: [email or web address]

## 2021-04-25 DIAGNOSIS — I10 Essential (primary) hypertension: Secondary | ICD-10-CM

## 2021-04-25 DIAGNOSIS — J439 Emphysema, unspecified: Secondary | ICD-10-CM

## 2021-04-26 ENCOUNTER — Telehealth: Payer: Self-pay

## 2021-04-26 NOTE — Patient Outreach (Signed)
[facility]) Care Management  04/26/2021  Rhonda Reynolds 1934-09-02 [id number]  Collaboration with RN Care Manager Reginia Naas who requests outreach to the patient in response to a voice message received regarding transportation for 2.2.23 appointment.   Successful outbound call placed to the patient who reports she has chosen to cancel transportation arranged by [employer] care guide Cyndi Bender due to the patients preference of being transported in a car only based on a past back injury. The patient reports she is only comfortable in a car. Patient reports she will plan to keep her appointment as scheduled on 2.2.23 with plans for a friend to transport her. Patient indicates if a friend in unavailable she will drive herself.  No further follow up by care guide team needed. Collaboration with RN Care Manager to advise of outcome of call and plan for patient to keep 2.2.23 appointment.  Daneen Schick, BSW, CDP Social Worker, Certified Dementia Practitioner [facility] [phone]

## 2021-04-27 ENCOUNTER — Ambulatory Visit: Payer: Medicare Other

## 2021-04-27 ENCOUNTER — Other Ambulatory Visit: Payer: Self-pay

## 2021-05-04 ENCOUNTER — Ambulatory Visit: Payer: Medicare Other

## 2021-05-17 ENCOUNTER — Other Ambulatory Visit: Payer: Self-pay

## 2021-05-17 ENCOUNTER — Ambulatory Visit (INDEPENDENT_AMBULATORY_CARE_PROVIDER_SITE_OTHER): Payer: Medicare Other | Admitting: *Deleted

## 2021-05-17 ENCOUNTER — Ambulatory Visit: Payer: Medicare Other

## 2021-05-17 DIAGNOSIS — I10 Essential (primary) hypertension: Secondary | ICD-10-CM

## 2021-05-17 DIAGNOSIS — J439 Emphysema, unspecified: Secondary | ICD-10-CM

## 2021-05-18 ENCOUNTER — Encounter: Payer: Self-pay | Admitting: *Deleted

## 2021-05-18 NOTE — Chronic Care Management (AMB) (Signed)
Chronic Care Management   CCM RN Visit Note  05/18/2021 Name: Rhonda Reynolds MRN: [id number] DOB: [birth year]  Subjective: Rhonda Reynolds is a 87 y.o. year old female who is a primary care patient of Burns, Claudina Lick, MD. The care management team was consulted for assistance with disease management and care coordination needs.    Engaged with patient by telephone for follow up visit in response to provider referral for case management and/or care coordination services.   Duplicate note: please disregard encounter and see completed documentation from 05/17/21  Consent to Services:  The patient was given information about Chronic Care Management services, agreed to services, and gave verbal consent prior to initiation of services.  Please see initial visit note for detailed documentation.  Patient agreed to services and verbal consent obtained.   Assessment: Review of patient past medical history, allergies, medications, health status, including review of consultants reports, laboratory and other test data, was performed as part of comprehensive evaluation and provision of chronic care management services.  CCM Care Plan  Allergies  Allergen Reactions   Forteo [Parathyroid Hormone (Recomb)]     Stomach problems   Other Other (See Comments)    Stomach problems   Pseudoephedrine Hcl Other (See Comments)    Other   Fosamax [Alendronate Sodium]     GERD    Outpatient Encounter Medications as of 05/17/2021  Medication Sig Note   amLODipine (NORVASC) 5 MG tablet TAKE 1 TABLET(5 MG) BY MOUTH DAILY (Patient not taking: Reported on 03/28/2021) 03/28/2021: Reports not taking- states medication has expired and she states she stopped taking-- encouraged patient to schedule PCP office visit for BP evaluation   aspirin EC 81 MG tablet Take 81 mg by mouth daily.    carboxymethylcellulose (REFRESH PLUS) 0.5 % SOLN 1 drop 3 (three) times daily as needed (DRY EYE).  (Patient not taking: Reported on  03/28/2021)    cholecalciferol (VITAMIN D) 1000 units tablet Take 3 tablets (3,000 Units total) by mouth daily.    cycloSPORINE (RESTASIS) 0.05 % ophthalmic emulsion Place 1 drop into both eyes 2 (two) times daily. (Patient not taking: Reported on 03/28/2021)    guaiFENesin (MUCINEX) 600 MG 12 hr tablet Take by mouth.    tiotropium (SPIRIVA HANDIHALER) 18 MCG inhalation capsule INHALE THE CONTENTS OF 1 CAPSULE VIA INHALATION DEVICE EVERY DAY    traMADol-acetaminophen (ULTRACET) 37.5-325 MG tablet Take 1 tablet by mouth every 6 (six) hours as needed. (Patient not taking: Reported on 03/28/2021)    Vitamin D, Ergocalciferol, (DRISDOL) 1.25 MG (50000 UNIT) CAPS capsule Take 1 capsule (50,000 Units total) by mouth every 7 (seven) days.    No facility-administered encounter medications on file as of 05/17/2021.    Patient Active Problem List   Diagnosis Date Noted   Acute midline low back pain without sciatica 08/19/2020   COPD with emphysema ([place]) 02/25/2019   Chronic pancreatitis ([place]) 01/09/2017   Abnormal CT of the chest 10/04/2016   Ovarian cyst 10/04/2016   Tortuous aorta ([place]) 08/30/2016   Abdominal pain 08/09/2016   Diabetes mellitus without complication ([place]) 25/36/6440   Protein-calorie malnutrition ([place]) 12/19/2015   Sensorineural hearing loss of both ears 12/19/2015   Weight loss 12/19/2015   Tobacco use disorder 12/19/2015   History of hyperthyroidism 12/19/2015   Essential hypertension 12/18/2015   PAD (peripheral artery disease) ([place]) 12/18/2015   Osteoporosis 12/18/2015   History of left breast cancer 12/18/2015   Neoplasm of left breast, primary tumor staging category Tis:  ductal carcinoma in situ (DCIS) 03/25/2013   Conditions to be addressed/monitored:  HTN and COPD  Care Plan : RN Care Manager Plan of Care  Updates made by Knox Royalty, RN since 05/18/2021 12:00 AM     Problem: Chronic Disease Management Needs   Priority: High     Long-Range Goal: Ongoing adherence  to established plan of care for long term chronic disease management   Start Date: 03/28/2021  Expected End Date: 03/28/2022  Priority: High  Note:   Current Barriers:  Chronic Disease Management support and education needs related to HTN and COPD  RNCM Clinical Goal(s):  Patient will demonstrate ongoing health management independence as evidenced by adherence to plan of care for COPD; HTN        through collaboration with RN Care manager, provider, and care team.   Interventions: 1:1 collaboration with primary care provider regarding development and update of comprehensive plan of care as evidenced by provider attestation and co-signature Inter-disciplinary care team collaboration (see longitudinal plan of care) Evaluation of current treatment plan related to  self management and patient's adherence to plan as established by provider  Duplicate note: please disregard encounter and see completed documentation from 05/17/21    Plan:The patient has been provided with contact information for the care management team and has been advised to call with any health related questions or concerns.   Oneta Rack, RN, BSN, [facility] RN Care Coordination- [place] [phone]: direct office

## 2021-05-18 NOTE — Patient Instructions (Signed)
Visit [place], thank you for taking time to talk with me today. Please don't hesitate to contact me if I can be of assistance to you before our next scheduled telephone appointment.  Below are the goals we discussed today:  Patient Self-Care Activities: Patient Rhonda Reynolds will: Take  medications as prescribed Attend all scheduled provider appointments Call pharmacy for medication refills Call provider office for new concerns or questions Try to follow heart healthy, low salt, low cholesterol diet Please make an appointment for your annual eye exam Please talk to your care providers about whether or not they recommend a flu vaccine shot now that it is nearing springtime Continue to take effort to prevent falls; continue to use your walker when you go outside of your home I am glad that you have scheduled your annual medicare wellness visit on June 14, 2021 at 2:20 pm: this is an appointment in the office at [facility] I am glad you have scheduled your annual physical exam with Dr. Quay Burow on September 13, 2021 at 2:00 pm: this is an appointment in the office at [place] next scheduled telephone follow up visit/ appointment with care management team member is scheduled on:  Monday, June 19, 2021 at 2:15 pm- This is a PHONE [place] appointment  If you need to cancel or re-schedule our visit, please call [phone] and our care guide team will be happy to assist you.   I look forward to hearing about your progress.   Oneta Rack, RN, BSN, [place] [phone]: direct office  If you are experiencing a Mental Health or [place] or need someone to talk to, please  call the Suicide and Crisis Lifeline: 988 call the [place] National Suicide Prevention Lifeline: 540 701 1215 or TTY: 915 790 7216 TTY (508)574-4329) to talk to a trained counselor call 1-800-273-TALK (toll free, 24 hour hotline) go to  [facility] [address], [place] [phone]) call 911   The patient verbalized understanding of instructions, educational materials, and care plan provided today and agreed to receive a mailed copy of patient instructions, educational materials, and care plan  Health Maintenance After Age 93 After age 80, you are at a higher risk for certain long-term diseases and infections as well as injuries from falls. Falls are a major cause of broken bones and head injuries in people who are older than age 38. Getting regular preventive care can help to keep you healthy and well. Preventive care includes getting regular testing and making lifestyle changes as recommended by your health care provider. Talk with your health care provider about: Which screenings and tests you should have. A screening is a test that checks for a disease when you have no symptoms. A diet and exercise plan that is right for you. What should I know about screenings and tests to prevent falls? Screening and testing are the best ways to find a health problem early. Early diagnosis and treatment give you the best chance of managing medical conditions that are common after age 10. Certain conditions and lifestyle choices may make you more likely to have a fall. Your health care provider may recommend: Regular vision checks. Poor vision and conditions such as cataracts can make you more likely to have a fall. If you wear glasses, make sure to get your prescription updated if your vision changes. Medicine review. Work with your health care provider to regularly review all of  the medicines you are taking, including over-the-counter medicines. Ask your health care provider about any side effects that may make you more likely to have a fall. Tell your health care provider if any medicines that you take make you feel dizzy or sleepy. Strength and balance checks. Your health care provider may recommend  certain tests to check your strength and balance while standing, walking, or changing positions. Foot health exam. Foot pain and numbness, as well as not wearing proper footwear, can make you more likely to have a fall. Screenings, including: Osteoporosis screening. Osteoporosis is a condition that causes the bones to get weaker and break more easily. Blood pressure screening. Blood pressure changes and medicines to control blood pressure can make you feel dizzy. Depression screening. You may be more likely to have a fall if you have a fear of falling, feel depressed, or feel unable to do activities that you used to do. Alcohol use screening. Using too much alcohol can affect your balance and may make you more likely to have a fall. Follow these instructions at home: Lifestyle Do not drink alcohol if: Your health care provider tells you not to drink. If you drink alcohol: Limit how much you have to: 0-1 drink a day for women. 0-2 drinks a day for men. Know how much alcohol is in your drink. In the [place] one drink equals one 12 oz bottle of beer (355 mL), one 5 oz glass of wine (148 mL), or one 1 oz glass of hard liquor (44 mL). Do not use any products that contain nicotine or tobacco. These products include cigarettes, chewing tobacco, and vaping devices, such as e-cigarettes. If you need help quitting, ask your health care provider. Activity  Follow a regular exercise program to stay fit. This will help you maintain your balance. Ask your health care provider what types of exercise are appropriate for you. If you need a cane or walker, use it as recommended by your health care provider. Wear supportive shoes that have nonskid soles. Safety  Remove any tripping hazards, such as rugs, cords, and clutter. Install safety equipment such as grab bars in bathrooms and safety rails on stairs. Keep rooms and walkways well-lit. General instructions Talk with your health care provider about your  risks for falling. Tell your health care provider if: You fall. Be sure to tell your health care provider about all falls, even ones that seem minor. You feel dizzy, tiredness (fatigue), or off-balance. Take over-the-counter and prescription medicines only as told by your health care provider. These include supplements. Eat a healthy diet and maintain a healthy weight. A healthy diet includes low-fat dairy products, low-fat (lean) meats, and fiber from whole grains, beans, and lots of fruits and vegetables. Stay current with your vaccines. Schedule regular health, dental, and eye exams. Summary Having a healthy lifestyle and getting preventive care can help to protect your health and wellness after age 40. Screening and testing are the best way to find a health problem early and help you avoid having a fall. Early diagnosis and treatment give you the best chance for managing medical conditions that are more common for people who are older than age 3. Falls are a major cause of broken bones and head injuries in people who are older than age 67. Take precautions to prevent a fall at home. Work with your health care provider to learn what changes you can make to improve your health and wellness and to prevent falls. This information is  not intended to replace advice given to you by your health care provider. Make sure you discuss any questions you have with your health care provider. Document Revised: 08/01/2020 Document Reviewed: 08/01/2020 Elsevier Patient Education  [place].

## 2021-05-18 NOTE — Chronic Care Management (AMB) (Signed)
Chronic Care Management   CCM RN Visit Note  05/18/2021 Name: Rhonda Reynolds MRN: [id number] DOB: 16-Apr-1934  Subjective: Rhonda Reynolds is a 87 y.o. year old female who is a primary care patient of Burns, Claudina Lick, MD. The care management team was consulted for assistance with disease management and care coordination needs.    Engaged with patient by telephone for follow up visit in response to provider referral for case management and/or care coordination services.   Consent to Services:  The patient was given information about Chronic Care Management services, agreed to services, and gave verbal consent prior to initiation of services.  Please see initial visit note for detailed documentation.  Patient agreed to services and verbal consent obtained.   Assessment: Review of patient past medical history, allergies, medications, health status, including review of consultants reports, laboratory and other test data, was performed as part of comprehensive evaluation and provision of chronic care management services.   CCM Care Plan  Allergies  Allergen Reactions   Forteo [Parathyroid Hormone (Recomb)]     Stomach problems   Other Other (See Comments)    Stomach problems   Pseudoephedrine Hcl Other (See Comments)    Other   Fosamax [Alendronate Sodium]     GERD   Outpatient Encounter Medications as of 05/17/2021  Medication Sig Note   amLODipine (NORVASC) 5 MG tablet TAKE 1 TABLET(5 MG) BY MOUTH DAILY (Patient not taking: Reported on 03/28/2021) 03/28/2021: Reports not taking- states medication has expired and she states she stopped taking-- encouraged patient to schedule PCP office visit for BP evaluation   aspirin EC 81 MG tablet Take 81 mg by mouth daily.    carboxymethylcellulose (REFRESH PLUS) 0.5 % SOLN 1 drop 3 (three) times daily as needed (DRY EYE).  (Patient not taking: Reported on 03/28/2021)    cholecalciferol (VITAMIN D) 1000 units tablet Take 3 tablets (3,000 Units total) by  mouth daily.    cycloSPORINE (RESTASIS) 0.05 % ophthalmic emulsion Place 1 drop into both eyes 2 (two) times daily. (Patient not taking: Reported on 03/28/2021)    guaiFENesin (MUCINEX) 600 MG 12 hr tablet Take by mouth.    tiotropium (SPIRIVA HANDIHALER) 18 MCG inhalation capsule INHALE THE CONTENTS OF 1 CAPSULE VIA INHALATION DEVICE EVERY DAY    traMADol-acetaminophen (ULTRACET) 37.5-325 MG tablet Take 1 tablet by mouth every 6 (six) hours as needed. (Patient not taking: Reported on 03/28/2021)    Vitamin D, Ergocalciferol, (DRISDOL) 1.25 MG (50000 UNIT) CAPS capsule Take 1 capsule (50,000 Units total) by mouth every 7 (seven) days.    No facility-administered encounter medications on file as of 05/17/2021.   Patient Active Problem List   Diagnosis Date Noted   Acute midline low back pain without sciatica 08/19/2020   COPD with emphysema ([place]) 02/25/2019   Chronic pancreatitis ([place]) 01/09/2017   Abnormal CT of the chest 10/04/2016   Ovarian cyst 10/04/2016   Tortuous aorta ([place]) 08/30/2016   Abdominal pain 08/09/2016   Diabetes mellitus without complication ([place]) 09/38/1829   Protein-calorie malnutrition ([place]) 12/19/2015   Sensorineural hearing loss of both ears 12/19/2015   Weight loss 12/19/2015   Tobacco use disorder 12/19/2015   History of hyperthyroidism 12/19/2015   Essential hypertension 12/18/2015   PAD (peripheral artery disease) ([place]) 12/18/2015   Osteoporosis 12/18/2015   History of left breast cancer 12/18/2015   Neoplasm of left breast, primary tumor staging category Tis: ductal carcinoma in situ (DCIS) 03/25/2013   Conditions to be addressed/monitored:  HTN and COPD  Care Plan : RN Care Manager Plan of Care  Updates made by Knox Royalty, RN since 05/18/2021 12:00 AM     Problem: Chronic Disease Management Needs   Priority: High     Long-Range Goal: Ongoing adherence to established plan of care for long term chronic disease management   Start Date: 03/28/2021   Expected End Date: 03/28/2022  Priority: High  Note:   Current Barriers:  Chronic Disease Management support and education needs related to HTN and COPD  RNCM Clinical Goal(s):  Patient will demonstrate ongoing health management independence as evidenced by adherence to plan of care for COPD; HTN        through collaboration with RN Care manager, provider, and care team.   Interventions: 1:1 collaboration with primary care provider regarding development and update of comprehensive plan of care as evidenced by provider attestation and co-signature Inter-disciplinary care team collaboration (see longitudinal plan of care) Evaluation of current treatment plan related to  self management and patient's adherence to plan as established by provider  05/17/21: noted from review of EHR that patient had cancelled our previously scheduled follow up call scheduled for 06/19/21, and scheduled appointment was this afternoon Contacted patient as scheduled per re-schedule appointment notes made by [place] [facility] team Patient reports that she was under the impression that the appointment she re-scheduled was for her AWE, and would be in the office- she was supposed to attend AWE several weeks ago and had to cancel Explained to patient that the re-scheduled appointment was made for CCM RN CM, by office staff Explained to patient that she should re-schedule AWE as well as annual physical exam with PCP while she is there today, present in the office- she states she needs assistance with scheduling these appointments Spoke with "Judson Roch" at check-in desk at patient's request and explained that patient needed to schedule AWE and annual physical exam with PCP- Judson Roch facvilitated scheduling these appointments today Re-spoke with patient after facilitating re-scheduling of these appointments and reinforced that those 2 appointments would be in-person appointments at the office and that my next appointment would be by telephone,  as we had previously scheduled on Monday, Aug 19, 2021 at 2:15- patient verbalized understanding: I will add these details to AVS and mail to patient per her request  From last outreach 04/24/21:  COPD Interventions:  (Status: 05/17/21:  Goal on track:  Yes.) Long Term Goal Advised patient to track and manage COPD triggers Advised patient to self assesses COPD action plan zone and make appointment with provider if in the yellow zone for 48 hours without improvement Advised patient to engage in light exercise as tolerated 3-5 days a week to aid in the the management of COPD Discussed the importance of adequate rest and management of fatigue with COPD  Hypertension Interventions:  (Status: 05/17/21: Goal on track:  Yes.) Long Term Goal Last practice recorded BP readings:  BP Readings from Last 3 Encounters:  11/10/20 (!) 186/101  10/07/20 (!) 168/93  08/25/20 129/87  Most recent eGFR/CrCl: No results found for: EGFR  No components found for: CRCL  Evaluation of current treatment plan related to hypertension self management and patient's adherence to plan as established by provider Discussed plans with patient for ongoing care management follow up and provided patient with direct contact information for care management team   Patient Goals/Self-Care Activities: As evidenced by review of EHR, collaboration with care team, and patient reporting during CCM RN CM outreach,  Patient [place] will: Take  medications as prescribed Attend all scheduled provider appointments Call pharmacy for medication refills Call provider office for new concerns or questions Try to follow heart healthy, low salt, low cholesterol diet Please make an appointment for your annual eye exam Please talk to your care providers about whether or not they recommend a flu vaccine shot now that it is nearing springtime Continue to take effort to prevent falls; continue to use your walker when you go outside of your home I am  glad that you have scheduled your annual medicare wellness visit on June 14, 2021 at 2:20 pm: this is an appointment in the office at [facility] I am glad you have scheduled your annual physical exam with Dr. Quay Burow on September 13, 2021 at 2:00 pm: this is an appointment in the office at [facility]: Telephone follow up appointment with care management team member scheduled for:  Monday, June 19, 2021 at 2:15 pm The patient has been provided with contact information for the care management team and has been advised to call with any health related questions or concerns  Oneta Rack, RN, BSN, [place] [phone]: direct office

## 2021-05-23 DIAGNOSIS — J439 Emphysema, unspecified: Secondary | ICD-10-CM

## 2021-05-23 DIAGNOSIS — I10 Essential (primary) hypertension: Secondary | ICD-10-CM

## 2021-05-28 ENCOUNTER — Other Ambulatory Visit: Payer: Self-pay | Admitting: Internal Medicine

## 2021-06-19 ENCOUNTER — Telehealth: Payer: Medicare Other

## 2021-06-19 ENCOUNTER — Ambulatory Visit (INDEPENDENT_AMBULATORY_CARE_PROVIDER_SITE_OTHER): Payer: Medicare Other | Admitting: *Deleted

## 2021-06-19 DIAGNOSIS — J439 Emphysema, unspecified: Secondary | ICD-10-CM

## 2021-06-19 DIAGNOSIS — I10 Essential (primary) hypertension: Secondary | ICD-10-CM

## 2021-06-19 NOTE — Chronic Care Management (AMB) (Signed)
?Chronic Care Management  ? ?CCM RN Visit Note ? ?06/19/2021 ?Name: BROOKELLE PELLICANE MRN: [id number] DOB: 1934/08/16 ? ?Subjective: ?NEAVEH BELANGER is a 87 y.o. year old female who is a primary care patient of Burns, Claudina Lick, MD. The care management team was consulted for assistance with disease management and care coordination needs.   ? ?Engaged with patient by telephone for follow up visit in response to provider referral for case management and/or care coordination services.  ? ?Consent to Services:  ?The patient was given information about Chronic Care Management services, agreed to services, and gave verbal consent prior to initiation of services.  Please see initial visit note for detailed documentation.  ?Patient agreed to services and verbal consent obtained.  ? ?Assessment: Review of patient past medical history, allergies, medications, health status, including review of consultants reports, laboratory and other test data, was performed as part of comprehensive evaluation and provision of chronic care management services.  ?CCM Care Plan ? ?Allergies  ?Allergen Reactions  ? Forteo [Parathyroid Hormone (Recomb)]   ?  Stomach problems  ? Other Other (See Comments)  ?  Stomach problems  ? Pseudoephedrine Hcl Other (See Comments)  ?  Other  ? Fosamax [Alendronate Sodium]   ?  GERD  ? ?Outpatient Encounter Medications as of 06/19/2021  ?Medication Sig  ? amLODipine (NORVASC) 5 MG tablet TAKE 1 TABLET(5 MG) BY MOUTH DAILY  ? aspirin EC 81 MG tablet Take 81 mg by mouth daily.  ? carboxymethylcellulose (REFRESH PLUS) 0.5 % SOLN 1 drop 3 (three) times daily as needed (DRY EYE).  (Patient not taking: Reported on 03/28/2021)  ? cholecalciferol (VITAMIN D) 1000 units tablet Take 3 tablets (3,000 Units total) by mouth daily.  ? cycloSPORINE (RESTASIS) 0.05 % ophthalmic emulsion Place 1 drop into both eyes 2 (two) times daily. (Patient not taking: Reported on 03/28/2021)  ? guaiFENesin (MUCINEX) 600 MG 12 hr tablet Take by  mouth.  ? tiotropium (SPIRIVA HANDIHALER) 18 MCG inhalation capsule INHALE THE CONTENTS OF 1 CAPSULE VIA INHALATION DEVICE EVERY DAY  ? traMADol-acetaminophen (ULTRACET) 37.5-325 MG tablet Take 1 tablet by mouth every 6 (six) hours as needed. (Patient not taking: Reported on 03/28/2021)  ? Vitamin D, Ergocalciferol, (DRISDOL) 1.25 MG (50000 UNIT) CAPS capsule Take 1 capsule (50,000 Units total) by mouth every 7 (seven) days.  ? ?No facility-administered encounter medications on file as of 06/19/2021.  ? ?Patient Active Problem List  ? Diagnosis Date Noted  ? Acute midline low back pain without sciatica 08/19/2020  ? COPD with emphysema ([place]) 02/25/2019  ? Chronic pancreatitis ([place]) 01/09/2017  ? Abnormal CT of the chest 10/04/2016  ? Ovarian cyst 10/04/2016  ? Tortuous aorta ([place]) 08/30/2016  ? Abdominal pain 08/09/2016  ? Diabetes mellitus without complication ([place]) 78/58/8502  ? Protein-calorie malnutrition ([place]) 12/19/2015  ? Sensorineural hearing loss of both ears 12/19/2015  ? Weight loss 12/19/2015  ? Tobacco use disorder 12/19/2015  ? History of hyperthyroidism 12/19/2015  ? Essential hypertension 12/18/2015  ? PAD (peripheral artery disease) ([place]) 12/18/2015  ? Osteoporosis 12/18/2015  ? History of left breast cancer 12/18/2015  ? Neoplasm of left breast, primary tumor staging category Tis: ductal carcinoma in situ (DCIS) 03/25/2013  ? ?Conditions to be addressed/monitored:  HTN and COPD ? ?Care Plan : RN Care Manager Plan of Care  ?Updates made by Knox Royalty, RN since 06/19/2021 12:00 AM  ?  ? ?Problem: Chronic Disease Management Needs   ?Priority: High  ?  ? ?  Long-Range Goal: Ongoing adherence to established plan of care for long term chronic disease management   ?Start Date: 03/28/2021  ?Expected End Date: 03/28/2022  ?Priority: High  ?Note:   ?Current Barriers:  ?Chronic Disease Management support and education needs related to HTN and COPD ? ?RNCM Clinical Goal(s):  ?Patient will demonstrate ongoing health  management independence as evidenced by adherence to plan of care for COPD; HTN        through collaboration with RN Care manager, provider, and care team.  ? ?Interventions: ?1:1 collaboration with primary care provider regarding development and update of comprehensive plan of care as evidenced by provider attestation and co-signature ?Inter-disciplinary care team collaboration (see longitudinal plan of care) ?Evaluation of current treatment plan related to  self management and patient's adherence to plan as established by provider ? ?06/19/21:  CCM RN CM Follow up Outreach completed as scheduled; at outset of call, patient tells me that she is packing up her home and has plans to move to [place] to be closer to her daughter; reports she will be moving "in just a few days;" she asks for the medical records phone number, and I explained that she would need to call the main phone number at [facility] and follow the automated prompts for the medical records department; this phone number was provided to patient; she also verbalized plans to cancel currently scheduled office visit for physical exam in June with Dr. Quay Burow- patient states she will do; she denies further concerns, issues/ problems today ? ?CCM RN CM Case closure accordingly- patient verbalized appreciation for all of Dr. Quay Burow' and CCM team care and assistance ? ?  ? ?Plan: ?No further follow up required: case closure today- patient moving to [place] this week to live closer to her daughter; PCP made aware accordingly ? ?Oneta Rack, RN, BSN, CCRN Alumnus ?[place] ?([phone]: direct office ? ? ? ? ? ? ? ? ?

## 2021-06-23 DIAGNOSIS — I10 Essential (primary) hypertension: Secondary | ICD-10-CM

## 2021-06-23 DIAGNOSIS — J439 Emphysema, unspecified: Secondary | ICD-10-CM

## 2021-08-15 ENCOUNTER — Telehealth: Payer: Self-pay | Admitting: Internal Medicine

## 2021-08-15 NOTE — Telephone Encounter (Signed)
LVM for pt to rtn my call to schedule AWV with [facility]. Call back # [phone] 

## 2021-09-13 ENCOUNTER — Ambulatory Visit: Payer: Medicare Other | Admitting: Internal Medicine

## 2022-04-26 DEATH — deceased
